# Patient Record
Sex: Female | Born: 1935 | Race: White | Hispanic: No | Marital: Married | Smoking: Never smoker
Health system: Southern US, Community
[De-identification: ages and names within clinical notes are randomized; demographics above are authoritative.]

## PROBLEM LIST (undated history)

## (undated) DIAGNOSIS — I10 Essential (primary) hypertension: Secondary | ICD-10-CM

## (undated) DIAGNOSIS — I82409 Acute embolism and thrombosis of unspecified deep veins of unspecified lower extremity: Secondary | ICD-10-CM

## (undated) DIAGNOSIS — I639 Cerebral infarction, unspecified: Secondary | ICD-10-CM

## (undated) DIAGNOSIS — N1831 Chronic kidney disease, stage 3a: Secondary | ICD-10-CM

## (undated) DIAGNOSIS — I313 Pericardial effusion (noninflammatory): Secondary | ICD-10-CM

## (undated) DIAGNOSIS — I3139 Other pericardial effusion (noninflammatory): Secondary | ICD-10-CM

## (undated) DIAGNOSIS — I351 Nonrheumatic aortic (valve) insufficiency: Secondary | ICD-10-CM

## (undated) DIAGNOSIS — F0390 Unspecified dementia without behavioral disturbance: Secondary | ICD-10-CM

## (undated) HISTORY — PX: ABDOMINAL HYSTERECTOMY: SHX81

---

## 2017-11-23 ENCOUNTER — Emergency Department (HOSPITAL_COMMUNITY)
Admission: EM | Admit: 2017-11-23 | Discharge: 2017-11-23 | Disposition: A | Discharge status: Home / Self Care | Payer: Medicare PPO | Attending: Emergency Medicine | Admitting: Emergency Medicine

## 2017-11-23 ENCOUNTER — Other Ambulatory Visit: Payer: Self-pay

## 2017-11-23 ENCOUNTER — Encounter (HOSPITAL_COMMUNITY): Payer: Self-pay

## 2017-11-23 DIAGNOSIS — R531 Weakness: Secondary | ICD-10-CM

## 2017-11-23 DIAGNOSIS — M6281 Muscle weakness (generalized): Secondary | ICD-10-CM | POA: Insufficient documentation

## 2017-11-23 DIAGNOSIS — I10 Essential (primary) hypertension: Secondary | ICD-10-CM | POA: Insufficient documentation

## 2017-11-23 DIAGNOSIS — Z8673 Personal history of transient ischemic attack (TIA), and cerebral infarction without residual deficits: Secondary | ICD-10-CM | POA: Diagnosis not present

## 2017-11-23 DIAGNOSIS — N3000 Acute cystitis without hematuria: Secondary | ICD-10-CM

## 2017-11-23 HISTORY — DX: Cerebral infarction, unspecified: I63.9

## 2017-11-23 HISTORY — DX: Essential (primary) hypertension: I10

## 2017-11-23 LAB — CBC WITH DIFFERENTIAL/PLATELET
BASOS PCT: 1 %
Basophils Absolute: 0.1 10*3/uL (ref 0.0–0.1)
EOS ABS: 0.1 10*3/uL (ref 0.0–0.7)
Eosinophils Relative: 1 %
HEMATOCRIT: 39.7 % (ref 36.0–46.0)
HEMOGLOBIN: 12.7 g/dL (ref 12.0–15.0)
Lymphocytes Relative: 26 %
Lymphs Abs: 1.9 10*3/uL (ref 0.7–4.0)
MCH: 30.8 pg (ref 26.0–34.0)
MCHC: 32 g/dL (ref 30.0–36.0)
MCV: 96.1 fL (ref 78.0–100.0)
MONOS PCT: 10 %
Monocytes Absolute: 0.8 10*3/uL (ref 0.1–1.0)
NEUTROS ABS: 4.6 10*3/uL (ref 1.7–7.7)
NEUTROS PCT: 62 %
Platelets: 237 10*3/uL (ref 150–400)
RBC: 4.13 MIL/uL (ref 3.87–5.11)
RDW: 13.8 % (ref 11.5–15.5)
WBC: 7.4 10*3/uL (ref 4.0–10.5)

## 2017-11-23 LAB — URINALYSIS, ROUTINE W REFLEX MICROSCOPIC
BILIRUBIN URINE: NEGATIVE
GLUCOSE, UA: NEGATIVE mg/dL
KETONES UR: NEGATIVE mg/dL
NITRITE: NEGATIVE
PH: 7 (ref 5.0–8.0)
Protein, ur: 30 mg/dL — AB
SPECIFIC GRAVITY, URINE: 1.006 (ref 1.005–1.030)

## 2017-11-23 LAB — BASIC METABOLIC PANEL
ANION GAP: 11 (ref 5–15)
BUN: 35 mg/dL — ABNORMAL HIGH (ref 6–20)
CALCIUM: 9.2 mg/dL (ref 8.9–10.3)
CHLORIDE: 108 mmol/L (ref 101–111)
CO2: 22 mmol/L (ref 22–32)
CREATININE: 1.4 mg/dL — AB (ref 0.44–1.00)
GFR calc Af Amer: 40 mL/min — ABNORMAL LOW (ref >60)
GFR calc non Af Amer: 34 mL/min — ABNORMAL LOW (ref >60)
Glucose, Bld: 106 mg/dL — ABNORMAL HIGH (ref 65–99)
Potassium: 4.6 mmol/L (ref 3.5–5.1)
SODIUM: 141 mmol/L (ref 135–145)

## 2017-11-23 LAB — I-STAT TROPONIN, ED: TROPONIN I, POC: 0.02 ng/mL (ref 0.00–0.08)

## 2017-11-23 MED ORDER — CEPHALEXIN 500 MG PO CAPS: ORAL_CAPSULE | ORAL | 0 refills | Status: DC

## 2017-11-23 MED ORDER — CEPHALEXIN 250 MG PO CAPS
1000.0000 mg | ORAL_CAPSULE | Freq: Once | ORAL | Status: AC
  Administered 2017-11-23: 1000 mg via ORAL
  Filled 2017-11-23: qty 4

## 2017-11-23 NOTE — ED Notes (Signed)
Per family the confusion is her baseline.  Family also states that the patient was brought here because her vision has been off and her heart rate was low and blood pressure was high.

## 2017-11-23 NOTE — ED Provider Notes (Signed)
[facility] EMERGENCY DEPARTMENT Provider Note   CSN: [id number] Arrival date & time: 11/23/17  1551     History   Chief Complaint Chief Complaint  Patient presents with  . bradycardia  . Hypotension  . Fall    HPI Tracy Valdez is a 84 y.o. female.  HPI Patient is at assisted living with her husband.  They had been to eat and were going back to the room.  She reports she was feeling a little weak and made mention of that.  She reports she really does not feel badly.  Her husband took her blood pressure and noted her blood pressure be slightly low with systolic 258N over diastolic 277 and heart rate low in the 40s.  No syncopal episode, no chest pain, no shortness of breath, no headache, no focal weakness numbness or tingling.  Patient denies recent fever, chills, cough, pain burning urgency with urination.  Review of medications indicate that she is taking carvedilol 25 mg daily amlodipine 10 mg daily and an ARB. Past Medical History:  Diagnosis Date  . Hypertension   . Stroke [facility])     There are no active problems to display for this patient.   History reviewed. No pertinent surgical history.  OB History    No data available       Home Medications    Prior to Admission medications   Medication Sig Start Date End Date Taking? Authorizing Provider  cephALEXin (KEFLEX) 500 MG capsule 2 caps po bid x 7 days 11/23/17   Charlesetta Shanks, MD    Family History No family history on file.  Social History Social History   Tobacco Use  . Smoking status: Not on file  Substance Use Topics  . Alcohol use: Not on file  . Drug use: Not on file     Allergies   Patient has no known allergies.   Review of Systems Review of Systems 10 Systems reviewed and are negative for acute change except as noted in the HPI.   Physical Exam Updated Vital Signs BP (!) 166/90   Pulse 70   Temp 98 F (36.7 C) (Oral)   Resp 20   Wt 61.2 kg (135 lb)   SpO2 97%    Physical Exam  Constitutional: She is oriented to person, place, and time. She appears well-developed and well-nourished. No distress.  HENT:  Head: Normocephalic and atraumatic.  Mouth/Throat: Oropharynx is clear and moist.  Eyes: Conjunctivae and EOM are normal. Pupils are equal, round, and reactive to light.  Neck: Neck supple.  Cardiovascular: Normal rate and regular rhythm.  No murmur heard. Pulmonary/Chest: Effort normal and breath sounds normal. No respiratory distress.  Abdominal: Soft. There is no tenderness.  Musculoskeletal: She exhibits no edema.  Neurological: She is alert and oriented to person, place, and time. No cranial nerve deficit. She exhibits normal muscle tone. Coordination normal.  Skin: Skin is warm and dry.  Psychiatric: She has a normal mood and affect.  Nursing note and vitals reviewed.    ED Treatments / Results  Labs (all labs ordered are listed, but only abnormal results are displayed) Labs Reviewed  BASIC METABOLIC PANEL - Abnormal; Notable for the following components:      Result Value   Glucose, Bld 106 (*)    BUN 35 (*)    Creatinine, Ser 1.40 (*)    GFR calc non Af Amer 34 (*)    GFR calc Af Amer 40 (*)  All other components within normal limits  URINALYSIS, ROUTINE W REFLEX MICROSCOPIC - Abnormal; Notable for the following components:   APPearance CLOUDY (*)    Hgb urine dipstick SMALL (*)    Protein, ur 30 (*)    Leukocytes, UA LARGE (*)    Bacteria, UA MANY (*)    Squamous Epithelial / LPF 0-5 (*)    All other components within normal limits  URINE CULTURE  CBC WITH DIFFERENTIAL/PLATELET  I-STAT TROPONIN, ED    EKG  EKG Interpretation  Date/Time:  Thursday November 23 2017 15:51:52 EST Ventricular Rate:  60 PR Interval:    QRS Duration: 114 QT Interval:  473 QTC Calculation: 473 R Axis:   73 Text Interpretation:  Sinus or ectopic atrial rhythm Atrial premature complexes Prolonged PR interval Possible  inferior infarct,  old no STEMI. no old comparison Confirmed by Charlesetta Shanks [phone]) on 11/23/2017 4:04:31 PM       Radiology No results found.  Procedures Procedures (including critical care time)  Medications Ordered in ED Medications  cephALEXin (KEFLEX) capsule 1,000 mg (not administered)     Initial Impression / Assessment and Plan / ED Course  I have reviewed the triage vital signs and the nursing notes.  Pertinent labs & imaging results that were available during my care of the patient were reviewed by me and considered in my medical decision making (see chart for details).        Final Clinical Impressions(s) / ED Diagnoses   Final diagnoses:  Generalized weakness  Acute cystitis without hematuria   Patient is alert and appropriate and asymptomatic.  She reports minimal symptoms that precipitated her visit to the emergency department.  Husband reports that he was concerned about her blood pressure and heart rate being to the low side.  Patient does have a grossly positive urinalysis.  I suspect this is the etiology for some mild generalized weakness that she was experiencing.  Will initiate Keflex twice daily.  We also discussed possibility of medication adjustments however patient's blood pressure and heart rate have not been hypotensive nor bradycardic in the emergency department.  There will be scheduled close follow-up with the PCP to monitor and make medication changes if needed.  Return precautions reviewed. ED Discharge Orders        Ordered    cephALEXin (KEFLEX) 500 MG capsule     11/23/17 1804       Charlesetta Shanks, MD 11/23/17 1191

## 2017-11-23 NOTE — ED Triage Notes (Signed)
Pt presents to the ED with ems. Per ems family states that her blood pressure and heart rate was low so they wanted her to get checked out.  Per the patient she fell two weeks ago and that is why she is here.  EMS unsure if the confusion is new or chronic, family is on the way.  The family just moved here from [place] and have not set up primary care.

## 2017-11-23 NOTE — Discharge Instructions (Signed)
1.  Start Keflex twice daily as prescribed.  You were given your first dose in the emergency department.  You may begin taking her prescription tomorrow morning. 2.  See your doctor for recheck as soon as possible. 3.  Return to the emergency department if you have any concerning or worsening symptoms. 4.  Decrease your carvedilol dose to half.  That is 12.5 mg twice daily.  Monitor your heart rate and your blood pressure and keep a log to show your physician.

## 2017-11-26 LAB — URINE CULTURE: Culture: >=100000 — AB

## 2017-11-27 ENCOUNTER — Telehealth: Payer: Self-pay | Admitting: Emergency Medicine

## 2017-11-27 NOTE — Telephone Encounter (Signed)
Post ED Visit - Positive Culture Follow-up  Culture report reviewed by antimicrobial stewardship pharmacist:  [x]  Elenor Quinones, Pharm.D. []  Heide Guile, Pharm.D., BCPS AQ-ID []  Parks Neptune, Pharm.D., BCPS []  Alycia Rossetti, Pharm.D., BCPS []  [place], Pharm.D., BCPS, AAHIVP []  Legrand Como, Pharm.D., BCPS, AAHIVP []  Salome Arnt, PharmD, BCPS []  Jalene Mullet, PharmD []  Vincenza Hews, PharmD, BCPS  Positive urine culture Treated with cephalexin, organism sensitive to the same and no further patient follow-up is required at this time.  Hazle Nordmann 11/27/2017, 9:59 AM

## 2018-04-06 ENCOUNTER — Encounter (HOSPITAL_BASED_OUTPATIENT_CLINIC_OR_DEPARTMENT_OTHER): Payer: Self-pay | Admitting: *Deleted

## 2018-04-06 ENCOUNTER — Emergency Department (HOSPITAL_BASED_OUTPATIENT_CLINIC_OR_DEPARTMENT_OTHER)
Admission: EM | Admit: 2018-04-06 | Discharge: 2018-04-06 | Disposition: A | Discharge status: Home / Self Care | Payer: Medicare PPO | Attending: Emergency Medicine | Admitting: Emergency Medicine

## 2018-04-06 ENCOUNTER — Other Ambulatory Visit: Payer: Self-pay

## 2018-04-06 ENCOUNTER — Emergency Department (HOSPITAL_BASED_OUTPATIENT_CLINIC_OR_DEPARTMENT_OTHER): Payer: Medicare PPO

## 2018-04-06 DIAGNOSIS — W19XXXA Unspecified fall, initial encounter: Secondary | ICD-10-CM

## 2018-04-06 DIAGNOSIS — S0990XA Unspecified injury of head, initial encounter: Secondary | ICD-10-CM | POA: Diagnosis present

## 2018-04-06 DIAGNOSIS — W01198A Fall on same level from slipping, tripping and stumbling with subsequent striking against other object, initial encounter: Secondary | ICD-10-CM | POA: Diagnosis not present

## 2018-04-06 DIAGNOSIS — I10 Essential (primary) hypertension: Secondary | ICD-10-CM | POA: Insufficient documentation

## 2018-04-06 DIAGNOSIS — Y9389 Activity, other specified: Secondary | ICD-10-CM | POA: Diagnosis not present

## 2018-04-06 DIAGNOSIS — Y92191 Dining room in other specified residential institution as the place of occurrence of the external cause: Secondary | ICD-10-CM | POA: Insufficient documentation

## 2018-04-06 DIAGNOSIS — S0003XA Contusion of scalp, initial encounter: Secondary | ICD-10-CM | POA: Diagnosis not present

## 2018-04-06 DIAGNOSIS — Y999 Unspecified external cause status: Secondary | ICD-10-CM | POA: Diagnosis not present

## 2018-04-06 NOTE — ED Provider Notes (Signed)
[facility] EMERGENCY DEPARTMENT Provider Note   CSN: [id number] Arrival date & time: 04/06/18  1519     History   Chief Complaint Chief Complaint  Patient presents with  . Fall    HPI Tracy Valdez is a 84 y.o. female.  84 year old female prior history of stroke and hypertension here with Fall at home.  She is brought in by EMS.  She denies any complaints.  She states she was using her walker after lunch and tripped over the rug and fell back and struck the right side of her head.  There is no LOC.  She denies head pain neck pain back pain chest pain abdominal pain numbness or weakness.   The history is provided by the patient and the EMS personnel.   Fall   This is a new problem. The current episode started less than 1 hour ago. The problem has not changed since onset.Pertinent negatives include no chest pain, no abdominal pain, no headaches and no shortness of breath.  Nothing aggravates the symptoms.  Nothing relieves the symptoms. She has tried nothing for the symptoms. The treatment provided no relief.    Past Medical History:  Diagnosis Date  . Hypertension   . Stroke [facility])     There are no active problems to display for this patient.   Past Surgical History:  Procedure Laterality Date  . ABDOMINAL HYSTERECTOMY       OB History   None      Home Medications    Prior to Admission medications   Medication Sig Start Date End Date Taking? Authorizing Provider  cephALEXin (KEFLEX) 500 MG capsule 2 caps po bid x 7 days 11/23/17   Charlesetta Shanks, MD    Family History History reviewed. No pertinent family history.  Social History Social History   Tobacco Use  . Smoking status: Never Smoker  . Smokeless tobacco: Never Used  Substance Use Topics  . Alcohol use: Never    Frequency: Never  . Drug use: Never     Allergies   Patient has no known allergies.   Review of Systems Review of Systems  Constitutional: Negative for fever.  HENT:  Negative for sore throat.   Eyes: Negative for visual disturbance.  Respiratory: Negative for shortness of breath.   Cardiovascular: Negative for chest pain.  Gastrointestinal: Negative for abdominal pain.  Genitourinary: Negative for dysuria.  Musculoskeletal: Negative for back pain and neck pain.  Skin: Negative for rash.  Neurological: Negative for seizures and headaches.     Physical Exam Updated Vital Signs BP (!) 168/40 (BP Location: Left Arm) Comment: RN Caryl Pina informed  Pulse 61   Temp 98.7 F (37.1 C) (Oral)   Resp 18   Ht 5' (1.524 m)   Wt 72.6 kg (160 lb)   SpO2 98%   BMI 31.25 kg/m    Physical Exam  Constitutional: She appears well-developed and well-nourished.  HENT:  Head: Normocephalic.  Right Ear: External ear normal.  Left Ear: External ear normal.  Nose: Nose normal.  Mouth/Throat: Oropharynx is clear and moist.  3x3cm hematoma right temproal area. No bleeding,   Eyes: Pupils are equal, round, and reactive to light. Conjunctivae and EOM are normal.  Neck: Neck supple.  Cardiovascular: Normal rate, regular rhythm, normal heart sounds and intact distal pulses.  Pulmonary/Chest: Effort normal. No respiratory distress. She has no wheezes. She has no rales.  Abdominal: Soft. There is no tenderness. There is no guarding.  Musculoskeletal: Normal range  of motion. She exhibits no tenderness or deformity.  Neurological: She is alert. She has normal strength. No sensory deficit. GCS eye subscore is 4. GCS verbal subscore is 5. GCS motor subscore is 6.  Skin: Skin is warm and dry. Capillary refill takes less than 2 seconds.  Psychiatric: She has a normal mood and affect.  Nursing note and vitals reviewed.    ED Treatments / Results  Labs (all labs ordered are listed, but only abnormal results are displayed) Labs Reviewed - No data to display  EKG None  Radiology Ct Head Wo Contrast  Result Date: 04/06/2018 CLINICAL DATA:  Patient fell at home today  striking her head. No loss of consciousness. History of hypertension and stroke. EXAM: CT HEAD WITHOUT CONTRAST CT CERVICAL SPINE WITHOUT CONTRAST TECHNIQUE: Multidetector CT imaging of the head and cervical spine was performed following the standard protocol without intravenous contrast. Multiplanar CT image reconstructions of the cervical spine were also generated. COMPARISON:  None. FINDINGS: CT HEAD FINDINGS Brain: There is no evidence of acute intracranial hemorrhage, mass lesion, brain edema or extra-axial fluid collection. There is generalized prominence of the ventricles and subarachnoid spaces consistent with atrophy. There is an old infarct in the right occipital lobe. Patchy low-density is present in the periventricular and subcortical white matter bilaterally. There is no CT evidence of acute cortical infarction. Vascular: Intracranial vascular calcifications. No hyperdense vessel identified. Skull: Negative for fracture or focal lesion. Sinuses/Orbits: The visualized paranasal sinuses and mastoid air cells are clear. No orbital abnormalities are seen. Other: Possible soft tissue swelling in the right parietal scalp. CT CERVICAL SPINE FINDINGS Alignment: Normal. Skull base and vertebrae: No evidence of acute fracture or traumatic subluxation. Soft tissues and spinal canal: No prevertebral fluid or swelling. No visible canal hematoma. Disc levels: There are mild-to-moderate degenerative changes throughout the cervical spine with disc space narrowing, endplate sclerosis and osteophytes greatest at C3-4. There are mild facet degenerative changes throughout the cervical spine. No acute disc herniation or high-grade spinal stenosis identified. Upper chest: Atherosclerosis of the great vessels with mild scarring in both lung apices. Other: None. IMPRESSION: 1. No acute intracranial findings. 2. Old right occipital infarct with chronic small vessel ischemic changes in the periventricular white matter and  generalized atrophy. 3. No evidence of acute cervical spine fracture, traumatic subluxation or static signs of instability. 4. Cervical spondylosis as described. Electronically Signed   By: Richardean Sale M.D.   On: 04/06/2018 16:04   Ct Cervical Spine Wo Contrast  Result Date: 04/06/2018 CLINICAL DATA:  Patient fell at home today striking her head. No loss of consciousness. History of hypertension and stroke. EXAM: CT HEAD WITHOUT CONTRAST CT CERVICAL SPINE WITHOUT CONTRAST TECHNIQUE: Multidetector CT imaging of the head and cervical spine was performed following the standard protocol without intravenous contrast. Multiplanar CT image reconstructions of the cervical spine were also generated. COMPARISON:  None. FINDINGS: CT HEAD FINDINGS Brain: There is no evidence of acute intracranial hemorrhage, mass lesion, brain edema or extra-axial fluid collection. There is generalized prominence of the ventricles and subarachnoid spaces consistent with atrophy. There is an old infarct in the right occipital lobe. Patchy low-density is present in the periventricular and subcortical white matter bilaterally. There is no CT evidence of acute cortical infarction. Vascular: Intracranial vascular calcifications. No hyperdense vessel identified. Skull: Negative for fracture or focal lesion. Sinuses/Orbits: The visualized paranasal sinuses and mastoid air cells are clear. No orbital abnormalities are seen. Other: Possible soft tissue swelling in the  right parietal scalp. CT CERVICAL SPINE FINDINGS Alignment: Normal. Skull base and vertebrae: No evidence of acute fracture or traumatic subluxation. Soft tissues and spinal canal: No prevertebral fluid or swelling. No visible canal hematoma. Disc levels: There are mild-to-moderate degenerative changes throughout the cervical spine with disc space narrowing, endplate sclerosis and osteophytes greatest at C3-4. There are mild facet degenerative changes throughout the cervical spine.  No acute disc herniation or high-grade spinal stenosis identified. Upper chest: Atherosclerosis of the great vessels with mild scarring in both lung apices. Other: None. IMPRESSION: 1. No acute intracranial findings. 2. Old right occipital infarct with chronic small vessel ischemic changes in the periventricular white matter and generalized atrophy. 3. No evidence of acute cervical spine fracture, traumatic subluxation or static signs of instability. 4. Cervical spondylosis as described. Electronically Signed   By: Richardean Sale M.D.   On: 04/06/2018 16:04    Procedures Procedures (including critical care time)  Medications Ordered in ED Medications - No data to display   Initial Impression / Assessment and Plan / ED Course  I have reviewed the triage vital signs and the nursing notes.  Pertinent labs & imaging results that were available during my care of the patient were reviewed by me and considered in my medical decision making (see chart for details).  Clinical Course as of Apr 07 1221  Fri Apr 06, 6569  [id number] 84 year old female not on anticoagulation here with a fall at home likely mechanical.  She does have a hematoma on her right temporal area.  Due to her age and the area of the injury I would get a head CT and C-spine CT.  Likely she can be discharged if they are negative.   [MB]    Clinical Course User Index [MB] Hayden Rasmussen, MD    Final Clinical Impressions(s) / ED Diagnoses   Final diagnoses:  Contusion of scalp, initial encounter  Fall, initial encounter    ED Discharge Orders    None      Hayden Rasmussen, MD 04/07/18 1222

## 2018-04-06 NOTE — ED Notes (Signed)
ED Provider at bedside. 

## 2018-04-06 NOTE — ED Triage Notes (Signed)
Family states unwitnessed fall  Today, denies complaints,  from independent living , brought by EMS

## 2018-04-06 NOTE — ED Notes (Signed)
Pt transported to CT at this time.

## 2018-04-06 NOTE — Discharge Instructions (Addendum)
Your evaluated in the emergency department for a fall with an injury to your head.  You had a CAT scan of your head and neck that did not show any obvious signs of injury other than the bruise.  You should use ice to the area Tylenol for pain and follow-up with your doctor.  Return to the emergency department if any worsening symptoms.

## 2019-08-18 ENCOUNTER — Emergency Department (HOSPITAL_COMMUNITY): Payer: Medicare Other

## 2019-08-18 ENCOUNTER — Other Ambulatory Visit: Payer: Self-pay

## 2019-08-18 ENCOUNTER — Encounter (HOSPITAL_COMMUNITY): Payer: Self-pay

## 2019-08-18 ENCOUNTER — Emergency Department (HOSPITAL_COMMUNITY)
Admission: EM | Admit: 2019-08-18 | Discharge: 2019-08-18 | Disposition: A | Discharge status: Home / Self Care | Payer: Medicare Other | Attending: Emergency Medicine | Admitting: Emergency Medicine

## 2019-08-18 DIAGNOSIS — Y939 Activity, unspecified: Secondary | ICD-10-CM | POA: Diagnosis not present

## 2019-08-18 DIAGNOSIS — I10 Essential (primary) hypertension: Secondary | ICD-10-CM | POA: Diagnosis not present

## 2019-08-18 DIAGNOSIS — Z8673 Personal history of transient ischemic attack (TIA), and cerebral infarction without residual deficits: Secondary | ICD-10-CM | POA: Insufficient documentation

## 2019-08-18 DIAGNOSIS — S0001XA Abrasion of scalp, initial encounter: Secondary | ICD-10-CM | POA: Diagnosis present

## 2019-08-18 DIAGNOSIS — Y929 Unspecified place or not applicable: Secondary | ICD-10-CM | POA: Diagnosis not present

## 2019-08-18 DIAGNOSIS — M542 Cervicalgia: Secondary | ICD-10-CM | POA: Diagnosis not present

## 2019-08-18 DIAGNOSIS — R519 Headache, unspecified: Secondary | ICD-10-CM | POA: Diagnosis not present

## 2019-08-18 DIAGNOSIS — W1830XA Fall on same level, unspecified, initial encounter: Secondary | ICD-10-CM | POA: Diagnosis not present

## 2019-08-18 DIAGNOSIS — Z79899 Other long term (current) drug therapy: Secondary | ICD-10-CM | POA: Diagnosis not present

## 2019-08-18 DIAGNOSIS — Y999 Unspecified external cause status: Secondary | ICD-10-CM | POA: Diagnosis not present

## 2019-08-18 DIAGNOSIS — W19XXXA Unspecified fall, initial encounter: Secondary | ICD-10-CM

## 2019-08-18 DIAGNOSIS — S0083XA Contusion of other part of head, initial encounter: Secondary | ICD-10-CM | POA: Insufficient documentation

## 2019-08-18 LAB — CBC WITH DIFFERENTIAL/PLATELET
Abs Immature Granulocytes: 0.03 10*3/uL (ref 0.00–0.07)
Basophils Absolute: 0.1 10*3/uL (ref 0.0–0.1)
Basophils Relative: 1 %
Eosinophils Absolute: 0.1 10*3/uL (ref 0.0–0.5)
Eosinophils Relative: 1 %
HCT: 37.6 % (ref 36.0–46.0)
Hemoglobin: 11.8 g/dL — ABNORMAL LOW (ref 12.0–15.0)
Immature Granulocytes: 0 %
Lymphocytes Relative: 17 %
Lymphs Abs: 1.2 10*3/uL (ref 0.7–4.0)
MCH: 30.7 pg (ref 26.0–34.0)
MCHC: 31.4 g/dL (ref 30.0–36.0)
MCV: 97.9 fL (ref 80.0–100.0)
Monocytes Absolute: 0.6 10*3/uL (ref 0.1–1.0)
Monocytes Relative: 8 %
Neutro Abs: 5.3 10*3/uL (ref 1.7–7.7)
Neutrophils Relative %: 73 %
Platelets: 220 10*3/uL (ref 150–400)
RBC: 3.84 MIL/uL — ABNORMAL LOW (ref 3.87–5.11)
RDW: 13.6 % (ref 11.5–15.5)
WBC: 7.2 10*3/uL (ref 4.0–10.5)
nRBC: 0 % (ref 0.0–0.2)

## 2019-08-18 LAB — BASIC METABOLIC PANEL
Anion gap: 8 (ref 5–15)
BUN: 29 mg/dL — ABNORMAL HIGH (ref 8–23)
CO2: 25 mmol/L (ref 22–32)
Calcium: 8.8 mg/dL — ABNORMAL LOW (ref 8.9–10.3)
Chloride: 110 mmol/L (ref 98–111)
Creatinine, Ser: 1.21 mg/dL — ABNORMAL HIGH (ref 0.44–1.00)
GFR calc Af Amer: 48 mL/min — ABNORMAL LOW (ref >60)
GFR calc non Af Amer: 41 mL/min — ABNORMAL LOW (ref >60)
Glucose, Bld: 154 mg/dL — ABNORMAL HIGH (ref 70–99)
Potassium: 4.4 mmol/L (ref 3.5–5.1)
Sodium: 143 mmol/L (ref 135–145)

## 2019-08-18 NOTE — ED Provider Notes (Signed)
[facility] DEPT Provider Note   CSN: [id number] Arrival date & time: 08/18/19  1215     History   Chief Complaint Chief Complaint  Patient presents with  . Fall  . hematoma    HPI Tracy Valdez is a 85 y.o. female.  Presents the ER after unwitnessed fall.  Patient reports yesterday she had a fall, cannot recall the events of the fall, does not remember losing consciousness, but also does not remember details about fall.  Denies any pain at this time, states she noted some bleeding from her head but denies noting any other trauma.  Specifically she denies any chest pain abdominal pain, back pain, neck pain or difficulty with ambulation.  Level 5 history caveat limited due to baseline dementia    HPI  Past Medical History:  Diagnosis Date  . Hypertension   . Stroke [facility])     There are no active problems to display for this patient.   Past Surgical History:  Procedure Laterality Date  . ABDOMINAL HYSTERECTOMY       OB History   No obstetric history on file.      Home Medications    Prior to Admission medications   Medication Sig Start Date End Date Taking? Authorizing Provider  amLODipine (NORVASC) 10 MG tablet Take 10 mg by mouth daily. 08/01/19  Yes [provider]  aspirin 81 MG chewable tablet Chew 81 mg by mouth.   Yes [provider]  atorvastatin (LIPITOR) 20 MG tablet Take 20 mg by mouth daily. 08/15/19  Yes [provider]  carvedilol (COREG) 12.5 MG tablet Take 12.5 mg by mouth 2 (two) times daily. 06/14/19  Yes [provider]  furosemide (LASIX) 20 MG tablet Take 20 mg by mouth daily. 07/18/19  Yes [provider]  irbesartan (AVAPRO) 150 MG tablet Take 300 mg by mouth daily. 06/02/19  Yes [provider]  memantine (NAMENDA) 10 MG tablet Take 10 mg by mouth 2 (two) times daily. 07/17/19  Yes [provider]  rivastigmine (EXELON) 1.5 MG capsule Take 1.5-3 mg by  mouth See admin instructions. Take 1 tablet in the morning, take 2 tablets in the evening 07/25/19  Yes [provider]    Family History Family History  Family history unknown: Yes    Social History Social History   Tobacco Use  . Smoking status: Never Smoker  . Smokeless tobacco: Never Used  Substance Use Topics  . Alcohol use: Never    Frequency: Never  . Drug use: Never     Allergies   Patient has no known allergies.   Review of Systems Review of Systems  Constitutional: Negative for chills and fever.  HENT: Negative for ear pain and sore throat.   Eyes: Negative for pain and visual disturbance.  Respiratory: Negative for cough and shortness of breath.   Cardiovascular: Negative for chest pain and palpitations.  Gastrointestinal: Negative for abdominal pain and vomiting.  Genitourinary: Negative for dysuria and hematuria.  Musculoskeletal: Negative for arthralgias and back pain.  Skin: Negative for color change and rash.  Neurological: Negative for seizures and syncope.  All other systems reviewed and are negative.    Physical Exam Updated Vital Signs BP (!) 165/33 (BP Location: Right Arm)   Pulse 60   Temp 98.2 F (36.8 C) (Oral)   Resp 16   Ht 5\' 5"  (1.651 m)   Wt 70.3 kg   SpO2 100%   BMI 25.79 kg/m  Physical Exam Vitals signs and nursing note reviewed.  Constitutional:      General: She is not in acute distress.    Appearance: She is well-developed.  HENT:     Head: Normocephalic and atraumatic.     Comments: Hematoma to posterior occiput, no active bleeding, 2 cm diameter superficial abrasion noted, no significant laceration Eyes:     Conjunctiva/sclera: Conjunctivae normal.  Neck:     Musculoskeletal: Neck supple.  Cardiovascular:     Rate and Rhythm: Normal rate and regular rhythm.     Heart sounds: No murmur.  Pulmonary:     Effort: Pulmonary effort is normal. No respiratory distress.     Breath sounds: Normal breath sounds.   Abdominal:     Palpations: Abdomen is soft.     Tenderness: There is no abdominal tenderness.  Skin:    General: Skin is warm and dry.  Neurological:     Mental Status: She is alert.     Comments: Alert, follows commands in all 4 extremities, speech clear      ED Treatments / Results  Labs (all labs ordered are listed, but only abnormal results are displayed) Labs Reviewed  CBC WITH DIFFERENTIAL/PLATELET - Abnormal; Notable for the following components:      Result Value   RBC 3.84 (*)    Hemoglobin 11.8 (*)    All other components within normal limits  BASIC METABOLIC PANEL - Abnormal; Notable for the following components:   Glucose, Bld 154 (*)    BUN 29 (*)    Creatinine, Ser 1.21 (*)    Calcium 8.8 (*)    GFR calc non Af Amer 41 (*)    GFR calc Af Amer 48 (*)    All other components within normal limits    EKG EKG Interpretation  Date/Time:  Sunday August 18 2019 14:07:33 EDT Ventricular Rate:  55 PR Interval:    QRS Duration: 98 QT Interval:  478 QTC Calculation: 457 R Axis:   35 Text Interpretation:  Junctional rhythm Inferior infarct , age undetermined Abnormal ECG Confirmed by Madalyn Rob [phone]) on 08/18/2019 2:15:52 PM   Radiology Dg Chest 2 View  Result Date: 08/18/2019 CLINICAL DATA:  85 year old female with history of trauma from a fall from standing. Chest pain. EXAM: CHEST - 2 VIEW COMPARISON:  No priors. FINDINGS: Lung volumes are normal. No consolidative airspace disease. No pleural effusions. No evidence of pulmonary edema. Heart size is mildly enlarged. Upper mediastinal contours are within normal limits. Aortic atherosclerosis. IMPRESSION: 1. No radiographic evidence of acute cardiopulmonary disease. 2. Mild cardiomegaly. 3. Aortic atherosclerosis. Electronically Signed   By: Vinnie Langton M.D.   On: 08/18/2019 13:50   Dg Pelvis 1-2 Views  Result Date: 08/18/2019 CLINICAL DATA:  Pain after fall EXAM: PELVIS - 1-2 VIEW COMPARISON:   None. FINDINGS: There is no evidence of pelvic fracture or diastasis. No pelvic bone lesions are seen. IMPRESSION: Negative. Electronically Signed   By: Dorise Bullion III M.D   On: 08/18/2019 13:53   Ct Head Wo Contrast  Result Date: 08/18/2019 CLINICAL DATA:  Headache, unwitnessed fall. Scalp hematoma EXAM: CT HEAD WITHOUT CONTRAST TECHNIQUE: Contiguous axial images were obtained from the base of the skull through the vertex without intravenous contrast. COMPARISON:  04/06/2018 FINDINGS: Brain: No evidence of acute infarction, hemorrhage, hydrocephalus, extra-axial collection or mass lesion/mass effect. Right occipital encephalomalacia related to a remote right PCA territory infarct, unchanged. Scattered low-density changes within the periventricular and subcortical white matter  compatible with chronic microvascular ischemic change. Moderate diffuse cerebral volume loss. Vascular: Mild atherosclerotic calcifications involving the large vessels of the skull base. No unexpected hyperdense vessel. Skull: Negative first skull fracture. Sinuses/Orbits: No acute finding. Other: Small superficial scalp hematoma overlies the right occipital bone. IMPRESSION: 1. No acute intracranial findings. 2. Small superficial scalp hematoma overlying the right occipital bone. No underlying skull fracture. 3. Chronic stable small vessel ischemic disease and cerebral volume loss. 4. Remote right PCA territory infarct. Electronically Signed   By: Davina Poke M.D.   On: 08/18/2019 13:48   Ct Cervical Spine Wo Contrast  Result Date: 08/18/2019 CLINICAL DATA:  Neck pain after fall EXAM: CT CERVICAL SPINE WITHOUT CONTRAST TECHNIQUE: Multidetector CT imaging of the cervical spine was performed without intravenous contrast. Multiplanar CT image reconstructions were also generated. COMPARISON:  None. FINDINGS: Alignment: Trace anterolisthesis C2 on C3. Facet joints are aligned without dislocation. 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 No acute fracture. No bone lesion. Soft tissues and spinal canal: No prevertebral fluid or swelling. No visible canal hematoma. Disc levels: Advanced intervertebral disc height loss at C3-4. Mildly prominent posterior disc osteophyte complex at C5-6. The ossified transverse ligament may cause mild stenosis at the C1-2 level. No evidence of high-grade canal stenosis by CT. Multilevel facet and uncovertebral arthropathy Upper chest: Lung apices clear. 13 mm right thyroid lobe nodule which does not meet criteria for dedicated thyroid imaging. Other: None. IMPRESSION: 1. No acute cervical spine fracture or posttraumatic subluxation. 2. 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 Electronically Signed   By: Davina Poke M.D.   On: 08/18/2019 13:56    Procedures Procedures (including critical care time)  Medications Ordered in ED Medications - No data to display   Initial Impression / Assessment and Plan / ED Course  I have reviewed the triage vital signs and the nursing notes.  Pertinent labs & imaging results that were available during my care of the patient were reviewed by me and considered in my medical decision making (see chart for details).  Clinical Course as of Aug 18 1923  Nancy Fetter Aug 18, 2019  1244 Complete initial assessment, will check screening labs, CT head   [RD]    Clinical Course User Index [RD] Lucrezia Starch, MD       85 year old unwitnessed fall, noted to have head trauma.  Here patient is well-appearing, no acute complaints, at mental baseline per report.  Noted superficial abrasion, no significant laceration over her posterior occiput.  CT head was negative for acute traumatic process.  Labs without acute change.  Will discharge back to facility.   Remained well appearing with stable vitals and no recurrence of bleeding from scalp abrasion.    After the discussed management above, the patient was determined to be safe for discharge.  The patient was in agreement with this plan and all questions regarding their care were answered.  ED return precautions were discussed and the patient will return to the ED with any significant worsening of condition.    Final Clinical Impressions(s) / ED Diagnoses   Final diagnoses:  Fall, initial encounter  Abrasion of scalp, initial encounter    ED Discharge Orders    None       Lucrezia Starch, MD 08/18/19 1925

## 2019-08-18 NOTE — ED Notes (Signed)
Attempted to call [facility],but no one answered for report.

## 2019-08-18 NOTE — ED Notes (Signed)
Pt daughter Omar Person [phone] in ED lobby; advised pt is unable to receive visitors d/t hallway bed. Pt daughter requests contact from MD/RN w/pt status/updates when possible. RN advised. [employer]

## 2019-08-18 NOTE — ED Notes (Signed)
Patient transported to CT 

## 2019-08-18 NOTE — Discharge Instructions (Addendum)
Recommend recheck with primary doctor next week.  Please return if she has any increased level of confusion, chest pain, difficulty breathing, abdominal pain or other new concerns symptom.  Please keep wound clean and dry.  Apply direct pressure as needed.  If direct pressure does not stop any further bleeding, recommend return to ER for reassessment.

## 2019-08-18 NOTE — ED Triage Notes (Signed)
Per EMS- patient is a resident of [facility]. Patient had an unwitnessed fall. Patient has a history of dementia and is oriented to self which is her baseline. Patient has a hematoma to the back of her head. No other injuries noted.

## 2019-09-16 ENCOUNTER — Emergency Department (HOSPITAL_COMMUNITY): Payer: Medicare Other

## 2019-09-16 ENCOUNTER — Emergency Department (HOSPITAL_COMMUNITY)
Admission: EM | Admit: 2019-09-16 | Discharge: 2019-09-16 | Disposition: A | Discharge status: Home / Self Care | Payer: Medicare Other | Attending: Emergency Medicine | Admitting: Emergency Medicine

## 2019-09-16 ENCOUNTER — Other Ambulatory Visit: Payer: Self-pay

## 2019-09-16 ENCOUNTER — Encounter (HOSPITAL_COMMUNITY): Payer: Self-pay

## 2019-09-16 DIAGNOSIS — W19XXXA Unspecified fall, initial encounter: Secondary | ICD-10-CM

## 2019-09-16 DIAGNOSIS — F0390 Unspecified dementia without behavioral disturbance: Secondary | ICD-10-CM | POA: Diagnosis not present

## 2019-09-16 DIAGNOSIS — S0240DA Maxillary fracture, left side, initial encounter for closed fracture: Secondary | ICD-10-CM

## 2019-09-16 DIAGNOSIS — S01112A Laceration without foreign body of left eyelid and periocular area, initial encounter: Secondary | ICD-10-CM | POA: Insufficient documentation

## 2019-09-16 DIAGNOSIS — Y92481 Parking lot as the place of occurrence of the external cause: Secondary | ICD-10-CM | POA: Insufficient documentation

## 2019-09-16 DIAGNOSIS — Z8673 Personal history of transient ischemic attack (TIA), and cerebral infarction without residual deficits: Secondary | ICD-10-CM | POA: Insufficient documentation

## 2019-09-16 DIAGNOSIS — Z79899 Other long term (current) drug therapy: Secondary | ICD-10-CM | POA: Insufficient documentation

## 2019-09-16 DIAGNOSIS — S0181XA Laceration without foreign body of other part of head, initial encounter: Secondary | ICD-10-CM

## 2019-09-16 DIAGNOSIS — Y9301 Activity, walking, marching and hiking: Secondary | ICD-10-CM | POA: Diagnosis not present

## 2019-09-16 DIAGNOSIS — W010XXA Fall on same level from slipping, tripping and stumbling without subsequent striking against object, initial encounter: Secondary | ICD-10-CM | POA: Insufficient documentation

## 2019-09-16 DIAGNOSIS — I10 Essential (primary) hypertension: Secondary | ICD-10-CM | POA: Diagnosis not present

## 2019-09-16 DIAGNOSIS — Y999 Unspecified external cause status: Secondary | ICD-10-CM | POA: Insufficient documentation

## 2019-09-16 MED ORDER — LIDOCAINE-EPINEPHRINE 2 %-1:200000 IJ SOLN
20.0000 mL | Freq: Once | INTRAMUSCULAR | Status: AC
Start: 2019-09-16 — End: 2019-09-16
  Administered 2019-09-16: 20 mL
  Filled 2019-09-16: qty 20

## 2019-09-16 NOTE — ED Triage Notes (Signed)
Per ems, pt had a witnessed fall. And has a facial laceration. Pt has a hxt of dementia, denies pain. Pt has a hxt of htn. No blood thinners per ems. 182/60, 97ra, 60hr, 161cbg.

## 2019-09-16 NOTE — ED Notes (Signed)
Pt ambulated without any problems 

## 2019-09-16 NOTE — Discharge Instructions (Addendum)
Do not blow your nose for the next week.

## 2019-09-16 NOTE — ED Provider Notes (Signed)
[facility] EMERGENCY DEPARTMENT Provider Note   CSN: [id number] Arrival date & time: 09/16/19  1708     History   Chief Complaint No chief complaint on file.   HPI Tracy Valdez is a 85 y.o. female.     The history is provided by the patient, the EMS personnel, the spouse and medical records. No language interpreter was used.   Tracy Valdez is a 85 y.o. female who presents to the Emergency Department complaining of fall. Level V caveat due to dementia. History is provided by EMS and the patient's husband. She presents the emergency department by EMS for evaluation of injuries following a mechanical fall. She was walking in a parking lot with a walker when the wheel caught in a crack, causing her to trip and fall forward, striking her face. No loss of consciousness. She complains of pain to her right face, no additional complaints. No recent illnesses. She takes no blood thinners. She does have a history of dementia. Past Medical History:  Diagnosis Date   Hypertension    Stroke [facility])     There are no active problems to display for this patient.   Past Surgical History:  Procedure Laterality Date   ABDOMINAL HYSTERECTOMY       OB History   No obstetric history on file.      Home Medications    Prior to Admission medications   Medication Sig Start Date End Date Taking? Authorizing Provider  amLODipine (NORVASC) 10 MG tablet Take 10 mg by mouth daily. 08/01/19   [provider]  aspirin 81 MG chewable tablet Chew 81 mg by mouth.    [provider]  atorvastatin (LIPITOR) 20 MG tablet Take 20 mg by mouth daily. 08/15/19   [provider]  carvedilol (COREG) 12.5 MG tablet Take 12.5 mg by mouth 2 (two) times daily. 06/14/19   [provider]  furosemide (LASIX) 20 MG tablet Take 20 mg by mouth daily. 07/18/19   [provider]  irbesartan (AVAPRO) 150 MG tablet Take 300 mg by mouth daily. 06/02/19   [provider]  memantine (NAMENDA) 10 MG tablet Take 10 mg by mouth 2 (two) times daily. 07/17/19   [provider]  rivastigmine (EXELON) 1.5 MG capsule Take 1.5-3 mg by mouth See admin instructions. Take 1 tablet in the morning, take 2 tablets in the evening 07/25/19   [provider]    Family History Family History  Family history unknown: Yes    Social History Social History   Tobacco Use   Smoking status: Never Smoker   Smokeless tobacco: Never Used  Substance Use Topics   Alcohol use: Never    Frequency: Never   Drug use: Never     Allergies   Patient has no known allergies.   Review of Systems Review of Systems  All other systems reviewed and are negative.    Physical Exam Updated Vital Signs BP (!) 172/62 (BP Location: Left Arm)    Pulse 72    Temp 97.7 F (36.5 C) (Oral)    Resp 16    SpO2 100%   Physical Exam Vitals signs and nursing note reviewed.  Constitutional:      Appearance: She is well-developed.  HENT:     Head: Normocephalic.     Comments: There is a laceration above the left eyebrow. There is mild left Perriorbital ecchymosis and swelling. There is an abrasion over the left cheek. Pupils equal  round and reactive. EOM I. Cardiovascular:     Rate and Rhythm: Normal rate and regular rhythm.  Pulmonary:     Effort: Pulmonary effort is normal. No respiratory distress.  Abdominal:     Palpations: Abdomen is soft.     Tenderness: There is no abdominal tenderness. There is no guarding or rebound.  Musculoskeletal:        General: No tenderness.     Comments: Trace edema to bilateral lower extremities  Skin:    General: Skin is warm and dry.  Neurological:     Mental Status: She is alert.     Comments: Oriented to person in place. Disoriented to time. Five out of five strength in all four extremities with sensation to light touch intact in all four extremities  Psychiatric:        Behavior: Behavior normal.      ED  Treatments / Results  Labs (all labs ordered are listed, but only abnormal results are displayed) Labs Reviewed - No data to display  EKG None  Radiology Ct Head Wo Contrast  Result Date: 09/16/2019 CLINICAL DATA:  Witnessed fall with trauma to the face.  Dementia. EXAM: CT HEAD WITHOUT CONTRAST CT MAXILLOFACIAL WITHOUT CONTRAST CT CERVICAL SPINE WITHOUT CONTRAST TECHNIQUE: Multidetector CT imaging of the head, cervical spine, and maxillofacial structures were performed using the standard protocol without intravenous contrast. Multiplanar CT image reconstructions of the cervical spine and maxillofacial structures were also generated. COMPARISON:  08/18/2019 FINDINGS: CT HEAD FINDINGS Brain: Chronic generalized atrophy. Old infarction in the right parietooccipital junction. Chronic small-vessel ischemic changes of the cerebral hemispheric white matter. No sign of acute infarction, mass lesion, hemorrhage, hydrocephalus or extra-axial collection. Vascular: There is atherosclerotic calcification of the major vessels at the base of the brain. Skull: No skull fracture. Other: None CT MAXILLOFACIAL FINDINGS Osseous: Fracture of the anterior wall the maxillary sinus on the left, depressed 1 or 2 mm only. No displacement. Nasal fractures, nondisplaced. Orbits: No intraorbital injury. Periorbital soft tissue swelling on the left superficially. Sinuses: Small amount of layering fluid in the left maxillary sinus secondary to the anterior wall fracture. Soft tissues: Left periorbital soft tissue swelling and left cheek swelling. CT CERVICAL SPINE FINDINGS Alignment: Normal Skull base and vertebrae: No fracture or primary bone lesion. Soft tissues and spinal canal: Negative Disc levels: Ordinary degenerative spondylosis most pronounced at C3-4. Ordinary facet osteoarthritis. Upper chest: Negative Other: None IMPRESSION: Head CT: No acute or traumatic finding. Atrophy and chronic small-vessel disease. Old right  parieto-occipital infarction. Maxillofacial CT: Fracture of the anterior wall left maxillary sinus, depressed 1 or 2 mm only. Fluid layering in the left maxillary sinus. Nondisplaced nasal fractures. Cervical spine CT: No acute or traumatic finding. Chronic degenerative changes. Electronically Signed   By: Nelson Chimes M.D.   On: 09/16/2019 20:17   Ct Cervical Spine Wo Contrast  Result Date: 09/16/2019 CLINICAL DATA:  Witnessed fall with trauma to the face.  Dementia. EXAM: CT HEAD WITHOUT CONTRAST CT MAXILLOFACIAL WITHOUT CONTRAST CT CERVICAL SPINE WITHOUT CONTRAST TECHNIQUE: Multidetector CT imaging of the head, cervical spine, and maxillofacial structures were performed using the standard protocol without intravenous contrast. Multiplanar CT image reconstructions of the cervical spine and maxillofacial structures were also generated. COMPARISON:  08/18/2019 FINDINGS: CT HEAD FINDINGS Brain: Chronic generalized atrophy. Old infarction in the right parietooccipital junction. Chronic small-vessel ischemic changes of the cerebral hemispheric white matter. No sign of acute infarction, mass lesion, hemorrhage, hydrocephalus or extra-axial collection. Vascular: There  is atherosclerotic calcification of the major vessels at the base of the brain. Skull: No skull fracture. Other: None CT MAXILLOFACIAL FINDINGS Osseous: Fracture of the anterior wall the maxillary sinus on the left, depressed 1 or 2 mm only. No displacement. Nasal fractures, nondisplaced. Orbits: No intraorbital injury. Periorbital soft tissue swelling on the left superficially. Sinuses: Small amount of layering fluid in the left maxillary sinus secondary to the anterior wall fracture. Soft tissues: Left periorbital soft tissue swelling and left cheek swelling. CT CERVICAL SPINE FINDINGS Alignment: Normal Skull base and vertebrae: No fracture or primary bone lesion. Soft tissues and spinal canal: Negative Disc levels: Ordinary degenerative spondylosis  most pronounced at C3-4. Ordinary facet osteoarthritis. Upper chest: Negative Other: None IMPRESSION: Head CT: No acute or traumatic finding. Atrophy and chronic small-vessel disease. Old right parieto-occipital infarction. Maxillofacial CT: Fracture of the anterior wall left maxillary sinus, depressed 1 or 2 mm only. Fluid layering in the left maxillary sinus. Nondisplaced nasal fractures. Cervical spine CT: No acute or traumatic finding. Chronic degenerative changes. Electronically Signed   By: Nelson Chimes M.D.   On: 09/16/2019 20:17   Ct Maxillofacial Wo Cm  Result Date: 09/16/2019 CLINICAL DATA:  Witnessed fall with trauma to the face.  Dementia. EXAM: CT HEAD WITHOUT CONTRAST CT MAXILLOFACIAL WITHOUT CONTRAST CT CERVICAL SPINE WITHOUT CONTRAST TECHNIQUE: Multidetector CT imaging of the head, cervical spine, and maxillofacial structures were performed using the standard protocol without intravenous contrast. Multiplanar CT image reconstructions of the cervical spine and maxillofacial structures were also generated. COMPARISON:  08/18/2019 FINDINGS: CT HEAD FINDINGS Brain: Chronic generalized atrophy. Old infarction in the right parietooccipital junction. Chronic small-vessel ischemic changes of the cerebral hemispheric white matter. No sign of acute infarction, mass lesion, hemorrhage, hydrocephalus or extra-axial collection. Vascular: There is atherosclerotic calcification of the major vessels at the base of the brain. Skull: No skull fracture. Other: None CT MAXILLOFACIAL FINDINGS Osseous: Fracture of the anterior wall the maxillary sinus on the left, depressed 1 or 2 mm only. No displacement. Nasal fractures, nondisplaced. Orbits: No intraorbital injury. Periorbital soft tissue swelling on the left superficially. Sinuses: Small amount of layering fluid in the left maxillary sinus secondary to the anterior wall fracture. Soft tissues: Left periorbital soft tissue swelling and left cheek swelling. CT  CERVICAL SPINE FINDINGS Alignment: Normal Skull base and vertebrae: No fracture or primary bone lesion. Soft tissues and spinal canal: Negative Disc levels: Ordinary degenerative spondylosis most pronounced at C3-4. Ordinary facet osteoarthritis. Upper chest: Negative Other: None IMPRESSION: Head CT: No acute or traumatic finding. Atrophy and chronic small-vessel disease. Old right parieto-occipital infarction. Maxillofacial CT: Fracture of the anterior wall left maxillary sinus, depressed 1 or 2 mm only. Fluid layering in the left maxillary sinus. Nondisplaced nasal fractures. Cervical spine CT: No acute or traumatic finding. Chronic degenerative changes. Electronically Signed   By: Nelson Chimes M.D.   On: 09/16/2019 20:17    Procedures Procedures (including critical care time)  Medications Ordered in ED Medications  lidocaine-EPINEPHrine (XYLOCAINE W/EPI) 2 %-1:200000 (PF) injection 20 mL (20 mLs Infiltration Given 09/16/19 1740)     Initial Impression / Assessment and Plan / ED Course  I have reviewed the triage vital signs and the nursing notes.  Pertinent labs & imaging results that were available during my care of the patient were reviewed by me and considered in my medical decision making (see chart for details).        Patient here for evaluation of injuries following a  mechanical fall. She has facial laceration and pain to her left face, no additional complaints. Wound repaired per PA note. Imaging significant for maxillary and nasal fractures. Discussed with patient and husband home care for facial laceration, abrasion and maxillary fracture. Discussed outpatient follow-up and return precautions.  Final Clinical Impressions(s) / ED Diagnoses   Final diagnoses:  Fall, initial encounter  Facial laceration, initial encounter  Closed fracture of left side of maxilla, initial encounter [facility])    ED Discharge Orders    None       Quintella Reichert, MD 09/16/19 2121

## 2019-09-16 NOTE — ED Provider Notes (Signed)
..  Laceration Repair  Date/Time: 09/16/2019 6:44 PM Performed by: Carlisle Cater, PA-C Authorized by: Carlisle Cater, PA-C   Consent:    Consent obtained:  Verbal   Consent given by:  Patient   Risks discussed:  Infection and pain   Alternatives discussed:  No treatment Anesthesia (see MAR for exact dosages):    Anesthesia method:  Local infiltration   Local anesthetic:  Lidocaine 2% WITH epi Laceration details:    Location:  Face   Face location:  L eyebrow   Length (cm):  2 Pre-procedure details:    Preparation:  Patient was prepped and draped in usual sterile fashion and imaging obtained to evaluate for foreign bodies Exploration:    Hemostasis achieved with:  Epinephrine and direct pressure   Wound exploration: wound explored through full range of motion and entire depth of wound probed and visualized     Contaminated: no   Treatment:    Area cleansed with:  Saline   Amount of cleaning:  Standard Skin repair:    Repair method:  Sutures   Suture size:  5-0   Suture material:  Nylon   Suture technique:  Simple interrupted   Number of sutures:  5 Approximation:    Approximation:  Close Post-procedure details:    Dressing:  Bulky dressing   Patient tolerance of procedure:  Tolerated well, no immediate complications Comments:     Patient tolerated well.  Persistent venous ooze improved with epinephrine and wound closure.      Carlisle Cater, PA-C 09/16/19 1845    Quintella Reichert, MD 09/16/19 2121

## 2020-01-02 ENCOUNTER — Other Ambulatory Visit: Payer: Self-pay

## 2020-01-02 ENCOUNTER — Encounter (HOSPITAL_BASED_OUTPATIENT_CLINIC_OR_DEPARTMENT_OTHER): Payer: Self-pay | Admitting: *Deleted

## 2020-01-02 ENCOUNTER — Emergency Department (HOSPITAL_BASED_OUTPATIENT_CLINIC_OR_DEPARTMENT_OTHER)
Admission: EM | Admit: 2020-01-02 | Discharge: 2020-01-02 | Disposition: A | Discharge status: Home / Self Care | Payer: Medicare Other | Attending: Emergency Medicine | Admitting: Emergency Medicine

## 2020-01-02 DIAGNOSIS — I10 Essential (primary) hypertension: Secondary | ICD-10-CM | POA: Diagnosis not present

## 2020-01-02 DIAGNOSIS — Z79899 Other long term (current) drug therapy: Secondary | ICD-10-CM | POA: Diagnosis not present

## 2020-01-02 DIAGNOSIS — F0390 Unspecified dementia without behavioral disturbance: Secondary | ICD-10-CM | POA: Diagnosis not present

## 2020-01-02 DIAGNOSIS — Z8673 Personal history of transient ischemic attack (TIA), and cerebral infarction without residual deficits: Secondary | ICD-10-CM | POA: Insufficient documentation

## 2020-01-02 DIAGNOSIS — Z7982 Long term (current) use of aspirin: Secondary | ICD-10-CM | POA: Insufficient documentation

## 2020-01-02 LAB — BASIC METABOLIC PANEL
Anion gap: 12 (ref 5–15)
BUN: 28 mg/dL — ABNORMAL HIGH (ref 8–23)
CO2: 25 mmol/L (ref 22–32)
Calcium: 8.8 mg/dL — ABNORMAL LOW (ref 8.9–10.3)
Chloride: 106 mmol/L (ref 98–111)
Creatinine, Ser: 1.26 mg/dL — ABNORMAL HIGH (ref 0.44–1.00)
GFR calc Af Amer: 46 mL/min — ABNORMAL LOW (ref >60)
GFR calc non Af Amer: 39 mL/min — ABNORMAL LOW (ref >60)
Glucose, Bld: 128 mg/dL — ABNORMAL HIGH (ref 70–99)
Potassium: 3.9 mmol/L (ref 3.5–5.1)
Sodium: 143 mmol/L (ref 135–145)

## 2020-01-02 LAB — CBC WITH DIFFERENTIAL/PLATELET
Abs Immature Granulocytes: 0.01 10*3/uL (ref 0.00–0.07)
Basophils Absolute: 0 10*3/uL (ref 0.0–0.1)
Basophils Relative: 1 %
Eosinophils Absolute: 0.1 10*3/uL (ref 0.0–0.5)
Eosinophils Relative: 1 %
HCT: 39.1 % (ref 36.0–46.0)
Hemoglobin: 12.4 g/dL (ref 12.0–15.0)
Immature Granulocytes: 0 %
Lymphocytes Relative: 25 %
Lymphs Abs: 1.9 10*3/uL (ref 0.7–4.0)
MCH: 30.7 pg (ref 26.0–34.0)
MCHC: 31.7 g/dL (ref 30.0–36.0)
MCV: 96.8 fL (ref 80.0–100.0)
Monocytes Absolute: 0.8 10*3/uL (ref 0.1–1.0)
Monocytes Relative: 10 %
Neutro Abs: 4.9 10*3/uL (ref 1.7–7.7)
Neutrophils Relative %: 63 %
Platelets: 185 10*3/uL (ref 150–400)
RBC: 4.04 MIL/uL (ref 3.87–5.11)
RDW: 13.6 % (ref 11.5–15.5)
WBC: 7.8 10*3/uL (ref 4.0–10.5)
nRBC: 0 % (ref 0.0–0.2)

## 2020-01-02 NOTE — ED Provider Notes (Signed)
[facility] EMERGENCY DEPARTMENT Provider Note   CSN: [id number] Arrival date & time: 01/02/20  1900     History Chief Complaint  Patient presents with  . Hypertension    Tracy Valdez is a 86 y.o. female.  Patient is a 86 year old female who presents with elevated blood pressure.  She has a history of dementia, hypertension and prior stroke.  She lives in assisted living facility.  The caretaker there noted her blood pressure to be increasing throughout the afternoon.  Her daughter is at bedside and says that she has had a difficult time controlling her blood pressure over the last few months.  She is at her baseline mental status.  She is denying any chest pain or shortness of breath although history is limited due to her dementia.  She has been ambulating at her baseline and no change in mental status per daughter.        Past Medical History:  Diagnosis Date  . Hypertension   . Stroke [facility])     There are no problems to display for this patient.   Past Surgical History:  Procedure Laterality Date  . ABDOMINAL HYSTERECTOMY       OB History   No obstetric history on file.     Family History  Family history unknown: Yes    Social History   Tobacco Use  . Smoking status: Never Smoker  . Smokeless tobacco: Never Used  Substance Use Topics  . Alcohol use: Never  . Drug use: Never    Home Medications Prior to Admission medications   Medication Sig Start Date End Date Taking? Authorizing Provider  amLODipine (NORVASC) 10 MG tablet Take 10 mg by mouth daily. 08/01/19   [provider]  aspirin 81 MG chewable tablet Chew 81 mg by mouth.    [provider]  atorvastatin (LIPITOR) 20 MG tablet Take 20 mg by mouth daily. 08/15/19   [provider]  carvedilol (COREG) 12.5 MG tablet Take 12.5 mg by mouth 2 (two) times daily. 06/14/19   [provider]  furosemide (LASIX) 20 MG tablet Take 20 mg by mouth daily. 07/18/19    [provider]  irbesartan (AVAPRO) 150 MG tablet Take 300 mg by mouth daily. 06/02/19   [provider]  memantine (NAMENDA) 10 MG tablet Take 10 mg by mouth 2 (two) times daily. 07/17/19   [provider]  rivastigmine (EXELON) 1.5 MG capsule Take 1.5-3 mg by mouth See admin instructions. Take 1 tablet in the morning, take 2 tablets in the evening 07/25/19   [provider]    Allergies    Patient has no known allergies.  Review of Systems   Review of Systems  Unable to perform ROS: Dementia    Physical Exam Updated Vital Signs BP (!) 160/54 (BP Location: Right Arm)   Pulse 72   Temp 98.1 F (36.7 C) (Oral)   Resp 16   Ht 5' (1.524 m)   Wt 48.5 kg   SpO2 98%   BMI 20.90 kg/m   Physical Exam Constitutional:      Appearance: She is well-developed.  HENT:     Head: Normocephalic and atraumatic.  Eyes:     Pupils: Pupils are equal, round, and reactive to light.  Cardiovascular:     Rate and Rhythm: Normal rate and regular rhythm.     Heart sounds: Normal heart sounds.  Pulmonary:     Effort: Pulmonary effort is normal. No respiratory distress.  Breath sounds: Normal breath sounds. No wheezing or rales.  Chest:     Chest wall: No tenderness.  Abdominal:     General: Bowel sounds are normal.     Palpations: Abdomen is soft.     Tenderness: There is no abdominal tenderness. There is no guarding or rebound.  Musculoskeletal:        General: Normal range of motion.     Cervical back: Normal range of motion and neck supple.  Lymphadenopathy:     Cervical: No cervical adenopathy.  Skin:    General: Skin is warm and dry.     Findings: No rash.  Neurological:     Mental Status: She is alert.     Comments: Patient is conversive and oriented to person.  She states she is in [place] but cannot tell me the year.  She moves all extremities symmetrically without focal deficits.  She has 5 out of 5 strength in all extremities.  No  facial droop noted.  Sensation grossly intact in all extremities.     ED Results / Procedures / Treatments   Labs (all labs ordered are listed, but only abnormal results are displayed) Labs Reviewed  BASIC METABOLIC PANEL - Abnormal; Notable for the following components:      Result Value   Glucose, Bld 128 (*)    BUN 28 (*)    Creatinine, Ser 1.26 (*)    Calcium 8.8 (*)    GFR calc non Af Amer 39 (*)    GFR calc Af Amer 46 (*)    All other components within normal limits  CBC WITH DIFFERENTIAL/PLATELET    EKG EKG Interpretation  Date/Time:  Thursday January 02 2020 19:31:07 EST Ventricular Rate:  63 PR Interval:    QRS Duration: 108 QT Interval:  467 QTC Calculation: 479 R Axis:   76 Text Interpretation: Sinus rhythm Atrial premature complexes Inferior infarct, old Probable anterolateral infarct, old since last tracing no significant change Confirmed by Malvin Johns [phone]) on 01/02/2020 7:48:44 PM   Radiology No results found.  Procedures Procedures (including critical care time)  Medications Ordered in ED Medications - No data to display  ED Course  I have reviewed the triage vital signs and the nursing notes.  Pertinent labs & imaging results that were available during my care of the patient were reviewed by me and considered in my medical decision making (see chart for details).    MDM Rules/Calculators/A&P                      Patient presents with elevated blood pressures per family.  She currently denies any symptoms.  Her labs are nonconcerning.  Her creatinine is mildly elevated but similar to prior values.  She has no changes on her EKG.  The automatic cuff showed a blood pressure of 217/55 although we did multiple repeated manual blood pressures throughout her ED stay and they were much lower with blood pressures on discharge at 160/54.  Given this, I did not want to aggressively treat her blood pressure.  She is currently asymptomatic without neurologic  deficits.  No change in behavior which would warrant CT imaging.  She was discharged home in good condition.  The daughter was encouraged to have the patient follow-up with her PCP.  Return precautions were given. Final Clinical Impression(s) / ED Diagnoses Final diagnoses:  Essential hypertension    Rx / DC Orders ED Discharge Orders    None  Malvin Johns, MD 01/02/20 2050

## 2020-01-02 NOTE — ED Triage Notes (Signed)
She lives in an  Assisted living facility. Her daughter brought her here for c.o HTN. Pt refused to come to the hospital and told her daughter she feels fine. She is alert and oriented.

## 2020-03-21 ENCOUNTER — Emergency Department (HOSPITAL_BASED_OUTPATIENT_CLINIC_OR_DEPARTMENT_OTHER): Payer: Medicare Other

## 2020-03-21 ENCOUNTER — Emergency Department (HOSPITAL_BASED_OUTPATIENT_CLINIC_OR_DEPARTMENT_OTHER)
Admission: EM | Admit: 2020-03-21 | Discharge: 2020-03-21 | Disposition: A | Discharge status: Home / Self Care | Payer: Medicare Other | Attending: Emergency Medicine | Admitting: Emergency Medicine

## 2020-03-21 ENCOUNTER — Encounter (HOSPITAL_BASED_OUTPATIENT_CLINIC_OR_DEPARTMENT_OTHER): Payer: Self-pay | Admitting: Emergency Medicine

## 2020-03-21 ENCOUNTER — Other Ambulatory Visit: Payer: Self-pay

## 2020-03-21 DIAGNOSIS — I10 Essential (primary) hypertension: Secondary | ICD-10-CM | POA: Insufficient documentation

## 2020-03-21 DIAGNOSIS — F0390 Unspecified dementia without behavioral disturbance: Secondary | ICD-10-CM | POA: Insufficient documentation

## 2020-03-21 DIAGNOSIS — Z79899 Other long term (current) drug therapy: Secondary | ICD-10-CM | POA: Insufficient documentation

## 2020-03-21 DIAGNOSIS — M79605 Pain in left leg: Secondary | ICD-10-CM | POA: Insufficient documentation

## 2020-03-21 DIAGNOSIS — M79672 Pain in left foot: Secondary | ICD-10-CM

## 2020-03-21 DIAGNOSIS — M25572 Pain in left ankle and joints of left foot: Secondary | ICD-10-CM | POA: Diagnosis present

## 2020-03-21 HISTORY — DX: Unspecified dementia, unspecified severity, without behavioral disturbance, psychotic disturbance, mood disturbance, and anxiety: F03.90

## 2020-03-21 MED ORDER — ACETAMINOPHEN 325 MG PO TABS
650.0000 mg | ORAL_TABLET | Freq: Once | ORAL | Status: AC
  Administered 2020-03-21: 650 mg via ORAL
  Filled 2020-03-21: qty 2

## 2020-03-21 NOTE — Discharge Instructions (Addendum)
Her xray and ultrasound are reassuring today. Elevate her leg as much as possible. Follow closely with her primary care. Return to the ED if her foot becomes very cold and blue or for redness to her skin.

## 2020-03-21 NOTE — ED Provider Notes (Signed)
[facility] Provider Note   CSN: [id number] Arrival date & time: 03/21/20  1337     History Chief Complaint  Patient presents with   Foot Swelling    Tracy Valdez is a 86 y.o. female w PMHx w dementia, HTN, stroke, presenting to the ED with complaint of left foot swelling and pain.  Patient's daughter provides history as patient has dementia and cannot provide for full history.  She states she noticed the swelling on Thursday however today she began complaining of pain to the lower foot and was unable to bear weight due to pain.  No medications were used to treat her pain.  Patient daughter is unaware of any injuries.  Patient has no known history of DVT.  Not on anticoagulation.  Uses a wheelchair at baseline.  The history is provided by a relative. The history is limited by the condition of the patient.       Past Medical History:  Diagnosis Date   Dementia ([place])    Hypertension    Stroke ([place])     There are no problems to display for this patient.   Past Surgical History:  Procedure Laterality Date   ABDOMINAL HYSTERECTOMY       OB History   No obstetric history on file.     Family History  Family history unknown: Yes    Social History   Tobacco Use   Smoking status: Never Smoker   Smokeless tobacco: Never Used  Substance Use Topics   Alcohol use: Never   Drug use: Never    Home Medications Prior to Admission medications   Medication Sig Start Date End Date Taking? Authorizing Provider  amLODipine (NORVASC) 10 MG tablet Take 10 mg by mouth daily. 08/01/19   [provider]  aspirin 81 MG chewable tablet Chew 81 mg by mouth.    [provider]  atorvastatin (LIPITOR) 20 MG tablet Take 20 mg by mouth daily. 08/15/19   [provider]  carvedilol (COREG) 12.5 MG tablet Take 12.5 mg by mouth 2 (two) times daily. 06/14/19   [provider]  furosemide (LASIX) 20 MG tablet Take 20  mg by mouth daily. 07/18/19   [provider]  irbesartan (AVAPRO) 150 MG tablet Take 300 mg by mouth daily. 06/02/19   [provider]  memantine (NAMENDA) 10 MG tablet Take 10 mg by mouth 2 (two) times daily. 07/17/19   [provider]  rivastigmine (EXELON) 1.5 MG capsule Take 1.5-3 mg by mouth See admin instructions. Take 1 tablet in the morning, take 2 tablets in the evening 07/25/19   [provider]    Allergies    Patient has no known allergies.  Review of Systems   Review of Systems  Unable to perform ROS: Dementia    Physical Exam Updated Vital Signs BP (!) 146/32    Pulse 64    Temp 98.8 F (37.1 C) (Oral)    Resp 18    Ht 5\' 1"  (1.549 m)    Wt 48.5 kg    SpO2 96%    BMI 20.22 kg/m   Physical Exam Vitals and nursing note reviewed.  Constitutional:      General: She is not in acute distress.    Appearance: She is well-developed.  HENT:     Head: Normocephalic and atraumatic.  Eyes:     Conjunctiva/sclera: Conjunctivae normal.  Cardiovascular:     Rate and Rhythm: Normal rate and regular rhythm.  Heart sounds: Murmur present. Systolic murmur present. Diastolic murmur present.  Pulmonary:     Effort: Pulmonary effort is normal.  Abdominal:     Palpations: Abdomen is soft.  Musculoskeletal:     Comments: Left foot swelling which extends up the lower leg. No redness or warmth. TTP over lateral aspect of foot, inferior to lateral malleolus. Pain in the area with passive dorsiflexion of the foot.  Normal pulses and capillary refill. Knee and hip are benign on exam.  Skin:    General: Skin is warm.  Neurological:     Mental Status: She is alert.  Psychiatric:        Mood and Affect: Mood normal.        Behavior: Behavior normal.     ED Results / Procedures / Treatments   Labs (all labs ordered are listed, but only abnormal results are displayed) Labs Reviewed - No data to display  EKG None  Radiology DG Ankle Complete  Left  Result Date: 03/21/2020 CLINICAL DATA:  Ankle pain and swelling, no known injury EXAM: LEFT ANKLE COMPLETE - 3+ VIEW; LEFT FOOT - COMPLETE 3+ VIEW COMPARISON:  None. FINDINGS: Osteopenia. No fracture or dislocation of the left foot or left ankle. Mild ankle mortise and midfoot arthrosis. Diffuse soft tissue edema about the foot and ankle. Vascular calcinosis. IMPRESSION: Diffuse soft tissue edema about the foot and ankle. No fracture or dislocation. Electronically Signed   By: Eddie Candle M.D.   On: 03/21/2020 15:03   US Venous Img Lower Unilateral Left  Result Date: 03/21/2020 CLINICAL DATA:  Left ankle pain and swelling for 3 days EXAM: LEFT LOWER EXTREMITY VENOUS DOPPLER ULTRASOUND TECHNIQUE: Gray-scale sonography with graded compression, as well as color Doppler and duplex ultrasound were performed to evaluate the lower extremity deep venous systems from the level of the common femoral vein and including the common femoral, femoral, profunda femoral, popliteal and calf veins including the posterior tibial, peroneal and gastrocnemius veins when visible. The superficial great saphenous vein was also interrogated. Spectral Doppler was utilized to evaluate flow at rest and with distal augmentation maneuvers in the common femoral, femoral and popliteal veins. COMPARISON:  None. FINDINGS: Contralateral Common Femoral Vein: Respiratory phasicity is normal and symmetric with the symptomatic side. No evidence of thrombus. Normal compressibility. Common Femoral Vein: No evidence of thrombus. Normal compressibility, respiratory phasicity and response to augmentation. Saphenofemoral Junction: No evidence of thrombus. Normal compressibility and flow on color Doppler imaging. Profunda Femoral Vein: No evidence of thrombus. Normal compressibility and flow on color Doppler imaging. Femoral Vein: No evidence of thrombus. Normal compressibility, respiratory phasicity and response to augmentation. Popliteal Vein: No  evidence of thrombus. Normal compressibility, respiratory phasicity and response to augmentation. Calf Veins: No evidence of thrombus. Normal compressibility and flow on color Doppler imaging. Superficial Great Saphenous Vein: No evidence of thrombus. Normal compressibility. Venous Reflux:  None. Other Findings: Complex fluid collection in the medial popliteal fossa is noted measuring 4.7 x 1.9 x 3.1 cm consistent with complex popliteal cyst. IMPRESSION: No evidence of deep venous thrombosis. Complex popliteal cyst. Electronically Signed   By: Inez Catalina M.D.   On: 03/21/2020 16:33   DG Foot Complete Left  Result Date: 03/21/2020 CLINICAL DATA:  Ankle pain and swelling, no known injury EXAM: LEFT ANKLE COMPLETE - 3+ VIEW; LEFT FOOT - COMPLETE 3+ VIEW COMPARISON:  None. FINDINGS: Osteopenia. No fracture or dislocation of the left foot or left ankle. Mild ankle mortise and midfoot arthrosis. Diffuse  soft tissue edema about the foot and ankle. Vascular calcinosis. IMPRESSION: Diffuse soft tissue edema about the foot and ankle. No fracture or dislocation. Electronically Signed   By: Eddie Candle M.D.   On: 03/21/2020 15:03    Procedures Procedures (including critical care time)  Medications Ordered in ED Medications  acetaminophen (TYLENOL) tablet 650 mg (650 mg Oral Given 03/21/20 1458)    ED Course  I have reviewed the triage vital signs and the nursing notes.  Pertinent labs & imaging results that were available during my care of the patient were reviewed by me and considered in my medical decision making (see chart for details).    MDM Rules/Calculators/A&P                      Patient with history of dementia, brought in by her daughter for left ankle/foot pain and swelling that began a few days ago.  She states initially her foot and ankle were swollen, however today she refused to put any weight on it was complaining of some pain.  On exam she has tenderness just inferior to the lateral  malleolus with some slight bruising present.  Pain is reproducible in this area with passive dorsi flexion.  The swelling does somewhat extend up the calf though there is no tenderness here.  Normal pulses and capillary refill.  X-ray is negative for acute fracture.  Proceeded with venous ultrasound to rule out DVT, this is also negative for DVT.  Patient's ankle placed in ASO brace for support.  Encouraged to follow closely with PCP.  Discussed reasons to return to the ED, including redness or warmth concerning for infection, or signs of decreased blood supply to the foot.  Safe for discharge.  Evaluated by Dr. Roslynn Amble, who agrees with work-up and care plan.  Discussed results, findings, treatment and follow up. Patient advised of return precautions. Patient's daughter verbalized understanding and agreed with plan.  Final Clinical Impression(s) / ED Diagnoses Final diagnoses:  Acute left ankle pain    Rx / DC Orders ED Discharge Orders    None       Juleah Paradise, [place] N, PA-C 03/21/20 1716    Lucrezia Starch, MD 03/22/20 [phone]

## 2020-03-21 NOTE — ED Triage Notes (Signed)
L foot pain and swelling since Thursday. Denies injury.

## 2020-08-17 ENCOUNTER — Inpatient Hospital Stay (HOSPITAL_COMMUNITY)
Admission: EM | Admit: 2020-08-17 | Discharge: 2020-08-22 | DRG: 291 | Disposition: A | Discharge status: Hospice | Payer: Medicare Other | Source: Skilled Nursing Facility | Attending: Internal Medicine | Admitting: Internal Medicine

## 2020-08-17 ENCOUNTER — Emergency Department (HOSPITAL_COMMUNITY): Payer: Medicare Other

## 2020-08-17 ENCOUNTER — Encounter (HOSPITAL_COMMUNITY): Payer: Self-pay | Admitting: Emergency Medicine

## 2020-08-17 ENCOUNTER — Inpatient Hospital Stay (HOSPITAL_COMMUNITY): Payer: Medicare Other

## 2020-08-17 DIAGNOSIS — I130 Hypertensive heart and chronic kidney disease with heart failure and stage 1 through stage 4 chronic kidney disease, or unspecified chronic kidney disease: Principal | ICD-10-CM | POA: Diagnosis present

## 2020-08-17 DIAGNOSIS — I313 Pericardial effusion (noninflammatory): Secondary | ICD-10-CM | POA: Diagnosis present

## 2020-08-17 DIAGNOSIS — Z6821 Body mass index (BMI) 21.0-21.9, adult: Secondary | ICD-10-CM | POA: Diagnosis not present

## 2020-08-17 DIAGNOSIS — R001 Bradycardia, unspecified: Secondary | ICD-10-CM | POA: Diagnosis present

## 2020-08-17 DIAGNOSIS — Z20822 Contact with and (suspected) exposure to covid-19: Secondary | ICD-10-CM | POA: Diagnosis present

## 2020-08-17 DIAGNOSIS — Z515 Encounter for palliative care: Secondary | ICD-10-CM

## 2020-08-17 DIAGNOSIS — Z993 Dependence on wheelchair: Secondary | ICD-10-CM | POA: Diagnosis not present

## 2020-08-17 DIAGNOSIS — I5031 Acute diastolic (congestive) heart failure: Secondary | ICD-10-CM | POA: Insufficient documentation

## 2020-08-17 DIAGNOSIS — F0390 Unspecified dementia without behavioral disturbance: Secondary | ICD-10-CM | POA: Diagnosis present

## 2020-08-17 DIAGNOSIS — I509 Heart failure, unspecified: Secondary | ICD-10-CM

## 2020-08-17 DIAGNOSIS — R609 Edema, unspecified: Secondary | ICD-10-CM | POA: Diagnosis not present

## 2020-08-17 DIAGNOSIS — J9601 Acute respiratory failure with hypoxia: Secondary | ICD-10-CM | POA: Diagnosis present

## 2020-08-17 DIAGNOSIS — R296 Repeated falls: Secondary | ICD-10-CM | POA: Diagnosis present

## 2020-08-17 DIAGNOSIS — Z90710 Acquired absence of both cervix and uterus: Secondary | ICD-10-CM | POA: Diagnosis not present

## 2020-08-17 DIAGNOSIS — I161 Hypertensive emergency: Secondary | ICD-10-CM | POA: Diagnosis present

## 2020-08-17 DIAGNOSIS — D631 Anemia in chronic kidney disease: Secondary | ICD-10-CM | POA: Diagnosis present

## 2020-08-17 DIAGNOSIS — I495 Sick sinus syndrome: Secondary | ICD-10-CM | POA: Diagnosis not present

## 2020-08-17 DIAGNOSIS — R5381 Other malaise: Secondary | ICD-10-CM | POA: Diagnosis present

## 2020-08-17 DIAGNOSIS — Z86718 Personal history of other venous thrombosis and embolism: Secondary | ICD-10-CM | POA: Diagnosis not present

## 2020-08-17 DIAGNOSIS — I10 Essential (primary) hypertension: Secondary | ICD-10-CM | POA: Diagnosis not present

## 2020-08-17 DIAGNOSIS — N182 Chronic kidney disease, stage 2 (mild): Secondary | ICD-10-CM | POA: Diagnosis present

## 2020-08-17 DIAGNOSIS — N184 Chronic kidney disease, stage 4 (severe): Secondary | ICD-10-CM | POA: Diagnosis not present

## 2020-08-17 DIAGNOSIS — L89220 Pressure ulcer of left hip, unstageable: Secondary | ICD-10-CM | POA: Diagnosis present

## 2020-08-17 DIAGNOSIS — I5033 Acute on chronic diastolic (congestive) heart failure: Secondary | ICD-10-CM | POA: Diagnosis present

## 2020-08-17 DIAGNOSIS — R627 Adult failure to thrive: Secondary | ICD-10-CM | POA: Diagnosis present

## 2020-08-17 DIAGNOSIS — R9431 Abnormal electrocardiogram [ECG] [EKG]: Secondary | ICD-10-CM | POA: Diagnosis present

## 2020-08-17 DIAGNOSIS — I351 Nonrheumatic aortic (valve) insufficiency: Secondary | ICD-10-CM | POA: Diagnosis present

## 2020-08-17 DIAGNOSIS — I455 Other specified heart block: Secondary | ICD-10-CM | POA: Diagnosis present

## 2020-08-17 DIAGNOSIS — I69354 Hemiplegia and hemiparesis following cerebral infarction affecting left non-dominant side: Secondary | ICD-10-CM

## 2020-08-17 DIAGNOSIS — Z79899 Other long term (current) drug therapy: Secondary | ICD-10-CM

## 2020-08-17 DIAGNOSIS — Z66 Do not resuscitate: Secondary | ICD-10-CM | POA: Diagnosis not present

## 2020-08-17 DIAGNOSIS — L97529 Non-pressure chronic ulcer of other part of left foot with unspecified severity: Secondary | ICD-10-CM | POA: Diagnosis present

## 2020-08-17 DIAGNOSIS — I441 Atrioventricular block, second degree: Secondary | ICD-10-CM | POA: Diagnosis present

## 2020-08-17 DIAGNOSIS — R52 Pain, unspecified: Secondary | ICD-10-CM

## 2020-08-17 HISTORY — DX: Pericardial effusion (noninflammatory): I31.3

## 2020-08-17 HISTORY — DX: Chronic kidney disease, stage 3a: N18.31

## 2020-08-17 HISTORY — DX: Nonrheumatic aortic (valve) insufficiency: I35.1

## 2020-08-17 HISTORY — DX: Other pericardial effusion (noninflammatory): I31.39

## 2020-08-17 HISTORY — DX: Acute embolism and thrombosis of unspecified deep veins of unspecified lower extremity: I82.409

## 2020-08-17 LAB — RESPIRATORY PANEL BY RT PCR (FLU A&B, COVID)
Influenza A by PCR: NEGATIVE
Influenza B by PCR: NEGATIVE
SARS Coronavirus 2 by RT PCR: NEGATIVE

## 2020-08-17 LAB — CBC
HCT: 32.9 % — ABNORMAL LOW (ref 36.0–46.0)
Hemoglobin: 10 g/dL — ABNORMAL LOW (ref 12.0–15.0)
MCH: 29.9 pg (ref 26.0–34.0)
MCHC: 30.4 g/dL (ref 30.0–36.0)
MCV: 98.2 fL (ref 80.0–100.0)
Platelets: 282 10*3/uL (ref 150–400)
RBC: 3.35 MIL/uL — ABNORMAL LOW (ref 3.87–5.11)
RDW: 13.4 % (ref 11.5–15.5)
WBC: 9.5 10*3/uL (ref 4.0–10.5)
nRBC: 0 % (ref 0.0–0.2)

## 2020-08-17 LAB — BRAIN NATRIURETIC PEPTIDE: B Natriuretic Peptide: 3923.3 pg/mL — ABNORMAL HIGH (ref 0.0–100.0)

## 2020-08-17 LAB — BASIC METABOLIC PANEL
Anion gap: 10 (ref 5–15)
BUN: 22 mg/dL (ref 8–23)
CO2: 21 mmol/L — ABNORMAL LOW (ref 22–32)
Calcium: 7.8 mg/dL — ABNORMAL LOW (ref 8.9–10.3)
Chloride: 106 mmol/L (ref 98–111)
Creatinine, Ser: 1.1 mg/dL — ABNORMAL HIGH (ref 0.44–1.00)
GFR, Estimated: 46 mL/min — ABNORMAL LOW (ref >60)
Glucose, Bld: 139 mg/dL — ABNORMAL HIGH (ref 70–99)
Potassium: 4.2 mmol/L (ref 3.5–5.1)
Sodium: 137 mmol/L (ref 135–145)

## 2020-08-17 LAB — TROPONIN I (HIGH SENSITIVITY)
Troponin I (High Sensitivity): 83 ng/L — ABNORMAL HIGH (ref <18)
Troponin I (High Sensitivity): 93 ng/L — ABNORMAL HIGH (ref <18)

## 2020-08-17 LAB — ECHOCARDIOGRAM COMPLETE: S' Lateral: 4.3 cm

## 2020-08-17 MED ORDER — ACETAMINOPHEN 325 MG PO TABS
650.0000 mg | ORAL_TABLET | ORAL | Status: DC | PRN
  Administered 2020-08-17 – 2020-08-20 (×5): 650 mg via ORAL
  Filled 2020-08-17 (×5): qty 2

## 2020-08-17 MED ORDER — FUROSEMIDE 10 MG/ML IJ SOLN
40.0000 mg | Freq: Once | INTRAMUSCULAR | Status: AC
  Administered 2020-08-17: 40 mg via INTRAVENOUS
  Filled 2020-08-17: qty 4

## 2020-08-17 MED ORDER — SODIUM CHLORIDE 0.9% FLUSH
3.0000 mL | Freq: Two times a day (BID) | INTRAVENOUS | Status: DC
  Administered 2020-08-18 – 2020-08-21 (×5): 3 mL via INTRAVENOUS

## 2020-08-17 MED ORDER — IRBESARTAN 300 MG PO TABS
150.0000 mg | ORAL_TABLET | Freq: Every day | ORAL | Status: DC
  Administered 2020-08-17 – 2020-08-19 (×3): 150 mg via ORAL
  Filled 2020-08-17 (×3): qty 1

## 2020-08-17 MED ORDER — ONDANSETRON HCL 4 MG/2ML IJ SOLN: 4.0000 mg | Freq: Four times a day (QID) | INTRAMUSCULAR | Status: DC | PRN

## 2020-08-17 MED ORDER — HYDRALAZINE HCL 50 MG PO TABS
100.0000 mg | ORAL_TABLET | Freq: Three times a day (TID) | ORAL | Status: DC
  Administered 2020-08-17 – 2020-08-20 (×8): 100 mg via ORAL
  Filled 2020-08-17 (×8): qty 2

## 2020-08-17 MED ORDER — ATORVASTATIN CALCIUM 10 MG PO TABS
20.0000 mg | ORAL_TABLET | Freq: Every day | ORAL | Status: DC
  Administered 2020-08-18 – 2020-08-20 (×3): 20 mg via ORAL
  Filled 2020-08-17 (×3): qty 2

## 2020-08-17 MED ORDER — ISOSORBIDE MONONITRATE ER 30 MG PO TB24: 30.0000 mg | ORAL_TABLET | Freq: Every day | ORAL | Status: DC

## 2020-08-17 MED ORDER — HYDRALAZINE HCL 20 MG/ML IJ SOLN: 2.0000 mg | Freq: Four times a day (QID) | INTRAMUSCULAR | Status: DC | PRN

## 2020-08-17 MED ORDER — ACETAMINOPHEN 500 MG PO TABS: 1000.0000 mg | ORAL_TABLET | Freq: Three times a day (TID) | ORAL | Status: DC | PRN

## 2020-08-17 MED ORDER — SODIUM CHLORIDE 0.9 % IV SOLN: 250.0000 mL | INTRAVENOUS | Status: DC | PRN

## 2020-08-17 MED ORDER — HYDRALAZINE HCL 50 MG PO TABS: 75.0000 mg | ORAL_TABLET | Freq: Three times a day (TID) | ORAL | Status: DC

## 2020-08-17 MED ORDER — FUROSEMIDE 10 MG/ML IJ SOLN
40.0000 mg | Freq: Every day | INTRAMUSCULAR | Status: DC
  Administered 2020-08-18 – 2020-08-19 (×2): 40 mg via INTRAVENOUS
  Filled 2020-08-17 (×2): qty 4

## 2020-08-17 MED ORDER — ENOXAPARIN SODIUM 40 MG/0.4ML ~~LOC~~ SOLN: 40.0000 mg | SUBCUTANEOUS | Status: DC

## 2020-08-17 MED ORDER — CARVEDILOL 3.125 MG PO TABS
12.5000 mg | ORAL_TABLET | Freq: Two times a day (BID) | ORAL | Status: DC
  Administered 2020-08-17: 12.5 mg via ORAL
  Filled 2020-08-17: qty 4

## 2020-08-17 MED ORDER — VITAMIN B-12 100 MCG PO TABS
100.0000 ug | ORAL_TABLET | Freq: Every day | ORAL | Status: DC
  Administered 2020-08-17 – 2020-08-20 (×4): 100 ug via ORAL
  Filled 2020-08-17 (×4): qty 1

## 2020-08-17 MED ORDER — SODIUM CHLORIDE 0.9% FLUSH: 3.0000 mL | INTRAVENOUS | Status: DC | PRN

## 2020-08-17 MED ORDER — RIVAROXABAN 15 MG PO TABS
15.0000 mg | ORAL_TABLET | Freq: Every day | ORAL | Status: DC
  Administered 2020-08-17 – 2020-08-18 (×2): 15 mg via ORAL
  Filled 2020-08-17 (×2): qty 1

## 2020-08-17 MED ORDER — MEMANTINE HCL 10 MG PO TABS
10.0000 mg | ORAL_TABLET | Freq: Every day | ORAL | Status: DC
  Administered 2020-08-18 – 2020-08-20 (×3): 10 mg via ORAL
  Filled 2020-08-17 (×3): qty 1

## 2020-08-17 MED ORDER — FERROUS SULFATE 325 (65 FE) MG PO TABS
325.0000 mg | ORAL_TABLET | Freq: Every day | ORAL | Status: DC
  Administered 2020-08-18 – 2020-08-20 (×3): 325 mg via ORAL
  Filled 2020-08-17 (×3): qty 1

## 2020-08-17 NOTE — ED Triage Notes (Signed)
Pt here from [facility]  Senior  living with c/o swelling and sats of 88 to 90 %  Placed on 2 liters by ems sats up to 99 % ,

## 2020-08-17 NOTE — Progress Notes (Signed)
  Echocardiogram 2D Echocardiogram has been performed.  Tracy Valdez 08/17/2020, 4:22 PM

## 2020-08-17 NOTE — ED Provider Notes (Addendum)
[facility] EMERGENCY DEPARTMENT Provider Note   CSN: [id number] Arrival date & time: 08/17/20  1112     History Chief Complaint  Patient presents with   Leg Swelling    Tracy Valdez is a 86 y.o. female history of dementia, hypertension, CVA, aortic insufficiency.  Patient presents today from [place] facility for concern of extremity edema and hypoxia.  History obtained from patient's RN Mervin Kung, [phone].  Over the past 6 days patient has had increasing bilateral lower extremity edema, they have doubled her p.o. Lasix from 20 mg daily to 40 mg without improvement of her symptoms.  They obtained a chest x-ray 5 days ago which showed fluid overload.  They report patient has gained 5 pounds in the last 1 week.  Today the nursing home physician evaluated the patient and patient's O2 saturations were in the eighties and EMS was called during patient into the ED.  Additionally they report patient had a 1 day fever of 102 F 6 days ago, patient had been started on Augmentin twice daily at that time and has not had any fever since then.  Patient has history of mention normally only alert and oriented to self, no change in mentation.  No recent falls, no blood thinner use.  Advises that the right hip appears deformed from a fall several months ago but there is no fracture there.  Patient is wheelchair-bound.  Level 5 caveat dementia - Additional history obtained by EMS, on their arrival patient with SPO2 saturation around 86% this improved to 99% on 2 L nasal cannula.  HPI     Past Medical History:  Diagnosis Date   Dementia ([place])    Hypertension    Stroke ([place])     There are no problems to display for this patient.   Past Surgical History:  Procedure Laterality Date   ABDOMINAL HYSTERECTOMY       OB History   No obstetric history on file.     Family History  Family history unknown: Yes    Social History   Tobacco Use   Smoking  status: Never Smoker   Smokeless tobacco: Never Used  Vaping Use   Vaping Use: Never used  Substance Use Topics   Alcohol use: Never   Drug use: Never    Home Medications Prior to Admission medications   Medication Sig Start Date End Date Taking? Authorizing Provider  amLODipine (NORVASC) 10 MG tablet Take 10 mg by mouth daily. 08/01/19   [provider]  aspirin 81 MG chewable tablet Chew 81 mg by mouth.    [provider]  atorvastatin (LIPITOR) 20 MG tablet Take 20 mg by mouth daily. 08/15/19   [provider]  carvedilol (COREG) 12.5 MG tablet Take 12.5 mg by mouth 2 (two) times daily. 06/14/19   [provider]  furosemide (LASIX) 20 MG tablet Take 20 mg by mouth daily. 07/18/19   [provider]  irbesartan (AVAPRO) 150 MG tablet Take 300 mg by mouth daily. 06/02/19   [provider]  memantine (NAMENDA) 10 MG tablet Take 10 mg by mouth 2 (two) times daily. 07/17/19   [provider]  rivastigmine (EXELON) 1.5 MG capsule Take 1.5-3 mg by mouth See admin instructions. Take 1 tablet in the morning, take 2 tablets in the evening 07/25/19   [provider]    Allergies    Patient has no known allergies.  Review of Systems   Review of Systems  Unable  to perform ROS: Dementia    Physical Exam Updated Vital Signs BP (!) 170/44 (BP Location: Right Arm)    Pulse 70    Temp 99.5 F (37.5 C) (Oral)    Resp 16    SpO2 93%   Physical Exam Constitutional:      General: She is not in acute distress.    Appearance: Normal appearance. She is well-developed. She is not ill-appearing or diaphoretic.  HENT:     Head: Normocephalic and atraumatic.  Eyes:     General: Vision grossly intact. Gaze aligned appropriately.     Extraocular Movements: Extraocular movements intact.     Pupils: Pupils are equal, round, and reactive to light.  Neck:     Trachea: Trachea and phonation normal. No tracheal tenderness or tracheal  deviation.  Cardiovascular:     Rate and Rhythm: Normal rate. Rhythm irregularly irregular.     Pulses:          Dorsalis pedis pulses are 1+ on the right side and 1+ on the left side.  Pulmonary:     Effort: Pulmonary effort is normal. No tachypnea, accessory muscle usage or respiratory distress.     Breath sounds: Normal air entry. Decreased breath sounds present.  Chest:     Chest wall: No tenderness.  Abdominal:     General: There is no distension.     Palpations: Abdomen is soft.     Tenderness: There is no abdominal tenderness. There is no guarding or rebound.  Musculoskeletal:        General: Normal range of motion.     Cervical back: Normal range of motion. No spinous process tenderness or muscular tenderness.     Right lower leg: 3+ Edema present.     Left lower leg: 3+ Edema present.     Comments: No midline C/T/L spinal tenderness to palpation, no paraspinal muscle tenderness, no deformity, crepitus, or step-off noted. No sign of injury to the neck or back. - Right lower extremity appears somewhat turned inward, no leg length discrepancy, right hip appears bowed out compared to left patient unable to tell if this is new or not.  Appropriate range of motion of all major joints for age.  Multiple old bruises present.  Feet:     Right foot:     Protective Sensation: 3 sites tested. 3 sites sensed.     Left foot:     Protective Sensation: 3 sites tested. 3 sites sensed.  Skin:    General: Skin is warm and dry.  Neurological:     Mental Status: She is alert.     GCS: GCS eye subscore is 4. GCS verbal subscore is 5. GCS motor subscore is 6.     Comments: Speech is clear and goal oriented, follows commands Major Cranial nerves without deficit, no facial droop Moves extremities without ataxia, coordination intact  Psychiatric:        Behavior: Behavior normal.     ED Results / Procedures / Treatments   Labs (all labs ordered are listed, but only abnormal results are  displayed) Labs Reviewed  BASIC METABOLIC PANEL - Abnormal; Notable for the following components:      Result Value   CO2 21 (*)    Glucose, Bld 139 (*)    Creatinine, Ser 1.10 (*)    Calcium 7.8 (*)    GFR, Estimated 46 (*)    All other components within normal limits  CBC - Abnormal; Notable for the following components:  RBC 3.35 (*)    Hemoglobin 10.0 (*)    HCT 32.9 (*)    All other components within normal limits  BRAIN NATRIURETIC PEPTIDE - Abnormal; Notable for the following components:   B Natriuretic Peptide 3,923.3 (*)    All other components within normal limits  TROPONIN I (HIGH SENSITIVITY) - Abnormal; Notable for the following components:   Troponin I (High Sensitivity) 83 (*)    All other components within normal limits  TROPONIN I (HIGH SENSITIVITY) - Abnormal; Notable for the following components:   Troponin I (High Sensitivity) 93 (*)    All other components within normal limits  RESPIRATORY PANEL BY RT PCR (FLU A&B, COVID)    EKG EKG Interpretation  Date/Time:  Monday August 17 2020 11:26:50 EDT Ventricular Rate:  85 PR Interval:    QRS Duration: 111 QT Interval:  442 QTC Calculation: 523 R Axis:   84 Text Interpretation: Atrial fibrillation Inferoposterior infarct, recent Abnormal lateral Q waves Abnormal T, consider ischemia, anterior leads Prolonged QT interval No significant change since last tracing Confirmed by Calvert Cantor [phone]) on 08/17/2020 11:34:19 AM   Radiology DG Chest 2 View  Result Date: 08/17/2020 CLINICAL DATA:  Status post fall. EXAM: CHEST - 2 VIEW COMPARISON:  August 18, 2019. FINDINGS: Stable cardiomegaly. No pneumothorax is noted. Mild left pleural effusion is noted with probable associated left basilar atelectasis or infiltrate. Central pulmonary vascular congestion is noted with possible bilateral perihilar edema. Bony thorax is unremarkable. IMPRESSION: Mild left pleural effusion with probable associated left basilar  atelectasis or infiltrate. Central pulmonary vascular congestion with possible bilateral perihilar edema. Aortic Atherosclerosis (ICD10-I70.0). Electronically Signed   By: Marijo Conception M.D.   On: 08/17/2020 12:19   DG Pelvis 1-2 Views  Result Date: 08/17/2020 CLINICAL DATA:  Fall. EXAM: PELVIS - 1-2 VIEW COMPARISON:  August 18, 2019. FINDINGS: There is no evidence of pelvic fracture or diastasis. No pelvic bone lesions are seen. IMPRESSION: Negative. Electronically Signed   By: Marijo Conception M.D.   On: 08/17/2020 12:21   DG FEMUR, MIN 2 VIEWS RIGHT  Result Date: 08/17/2020 CLINICAL DATA:  Fall. EXAM: RIGHT FEMUR 2 VIEWS COMPARISON:  None. FINDINGS: There is no evidence of fracture or other focal bone lesions. Soft tissues are unremarkable. IMPRESSION: Negative. Electronically Signed   By: Marijo Conception M.D.   On: 08/17/2020 12:22    Procedures .Critical Care Performed by: Deliah Boston, PA-C Authorized by: Deliah Boston, PA-C   Critical care provider statement:    Critical care time (minutes):  32   Critical care was necessary to treat or prevent imminent or life-threatening deterioration of the following conditions:  Respiratory failure   Critical care was time spent personally by me on the following activities:  Discussions with consultants, evaluation of patient's response to treatment, examination of patient, ordering and performing treatments and interventions, ordering and review of laboratory studies, ordering and review of radiographic studies, pulse oximetry, re-evaluation of patient's condition, obtaining history from patient or surrogate, review of old charts and development of treatment plan with patient or surrogate   (including critical care time)  Medications Ordered in ED Medications  furosemide (LASIX) injection 40 mg (has no administration in time range)    ED Course  I have reviewed the triage vital signs and the nursing notes.  Pertinent labs &  imaging results that were available during my care of the patient were reviewed by me and considered in my medical  decision making (see chart for details).    MDM Rules/Calculators/A&P                         Additional history obtained from: 1. Nursing notes from this visit. 2. EMS. 3. Nursing facility staff. -------------------------- 86 year old female arrives from nursing facility for fluid overload and hypoxia.  She recently had increase of her Lasix over the past 5 days but has still gained 5 pounds.  She does have a history of a fever around 5 or 6 days ago but has been on Augmentin twice daily since that time.  She has no history of productive cough nausea vomiting or other infectious type symptoms.  She denies any chest pain or shortness of breath on exam.  She has dementia but baseline mental status.  On initial exam there did appear to be a deformity and some internal rotation of her right leg so x-ray of the pelvis and right femur were ordered.  After these were obtained I was able to contact nursing facility staff who reported right hip is an old injury from a fall several months ago.  Basic blood work as well as a BNP troponin and chest x-ray were ordered.  Anticipate admission given oxygen requirement. - I ordered, reviewed and interpreted labs which include: CBC shows mild anemia of 10.0, no leukocytosis to suggest infection. Covid/influenza panel negative. BMP shows baseline creatinine of 1.1, no emergent electrolyte derangement, AKI or gap. High-sensitivity troponin of 93, suspect secondary to CHF exacerbation. BNP 3923, supportive of CHF exacerbation.  Chest x-ray:  IMPRESSION:  Mild left pleural effusion with probable associated left basilar  atelectasis or infiltrate. Central pulmonary vascular congestion  with possible bilateral perihilar edema.    Aortic Atherosclerosis (ICD10-I70.0).   DG Pelvis:  IMPRESSION:  Negative.   DG Right Femur:  IMPRESSION:    Negative.   EKG: Atrial fibrillation Inferoposterior infarct, recent Abnormal lateral Q waves Abnormal T, consider ischemia, anterior leads Prolonged QT interval No significant change since last tracing Confirmed by Calvert Cantor [phone]) on 08/17/2020 11:34:19 AM ------------ Based on history physical examination and work-up above I have low suspicion for infection at this time suspect patient with CHF exacerbation.  Do not feel patient would benefit from antibiotics at this point.  40 mg IV Lasix ordered for treatment of CHF.  Patient seen and evaluated by Dr. Karle Starch during this visit agrees with admission. - Patient reassessed she is resting comfortably in bed no acute distress husband at bedside.  They are agreeable for admission, no further questions or concerns. Patient remains pain-free reports she is feeling well. - 2:04 PM: Discussed case with Dr. Roosevelt Locks, patient accepted to hospitalist service. ================= Addendum 6:55 PM: During changing a skin ulcer nursing staff noticed bedsore of the left hip.  I reevaluated this patient she has approximately, moderate sized bedsore present with eschar, no significant surrounding cellulitis or streaking.  X-rays of the left hip earlier were negative for osteomyelitis.  I updated Dr. Roosevelt Locks at 6:58 PM, he is aware of the skin breakdown.  Note: Portions of this report may have been transcribed using voice recognition software. Every effort was made to ensure accuracy; however, inadvertent computerized transcription errors may still be present. Final Clinical Impression(s) / ED Diagnoses Final diagnoses:  Acute on chronic congestive heart failure, unspecified heart failure type ([place])  Acute respiratory failure with hypoxia ([place])    Rx / DC Orders ED Discharge Orders    None  Gari Crown 08/17/20 1407    Truddie Hidden, MD 08/17/20 [place], PA-C 08/17/20 1900    Truddie Hidden,  MD 08/17/20 [phone]

## 2020-08-17 NOTE — H&P (Signed)
History and Physical    Tracy Valdez:[id number] DOB: 07-12-1934 DOA: 08/17/2020  PCP: Curly Rim, MD (Confirm with patient/family/NH records and if not entered, this has to be entered at [facility] point of entry) Patient coming from: Nursing home  I have personally briefly reviewed patient's old medical records in [place]  Chief Complaint: SOB, leg swelling  HPI: Tracy Valdez is a 86 y.o. female with medical history significant of refractory hypertension, diastolic CHF, DVT off anticoagulation, CKD stage II, stroke with left-sided weakness and chronic ambulation dysfunction, dementia, presented with increasing short of breath and leg swelling for 1 month.  Patient is a nursing home resident for last 4+ months for worsening of ambulation function and has been wheelchair-bound since summer this year.  Patient is baseline demented, most of the history provided by husband at bedside.  According to outpatient records patient has had refractory hypertension and been following with [facility] cardiologist who has been adjusting patient's blood pressure meds several times this year.  Patient started to have increasing leg swelling and shortness of breath for 3 to 4 weeks, since last week nursing home has been increasing patient Lasix from 20 mg daily 40 mg days without significant improvement, patient also had a brief episode of fever 7 days ago and started on Augmentin which is to be completed today.  Patient denies any cough, no abdominal pain or diarrhea, no urinary symptoms.  Denies any fever or chills.  Husband at bedside also reported patient has had several episodes of bradycardia with her heart rate dropped to 40s to 50s, despite, it appeared that her blood pressure has been stable. ED Course: Patient was found to be hypoxic, stabilized on 2 L oxygen O2 saturation 98%.  Hemoglobin 10, WBC 9.5, sodium 137, potassium 4.2, creatinine 1.1.  Blood pressure remained on the higher side systolic 440N  to 027O, chest x-ray showed pulmonary congestion with left-sided pleural effusion which is new compared to chest x-ray 1 year ago. Trop 83>93, EKG showed A. fib.  Review of Systems: .    Past Medical History:  Diagnosis Date  . Dementia ([place])   . Hypertension   . Stroke [facility])     Past Surgical History:  Procedure Laterality Date  . ABDOMINAL HYSTERECTOMY       reports that she has never smoked. She has never used smokeless tobacco. She reports that she does not drink alcohol and does not use drugs.  No Known Allergies  Family History  Family history unknown: Yes     Prior to Admission medications   Medication Sig Start Date End Date Taking? Authorizing Provider  acetaminophen (TYLENOL) 500 MG tablet Take 1,000 mg by mouth every 8 (eight) hours as needed for moderate pain.   Yes [provider]  amoxicillin-clavulanate (AUGMENTIN) 875-125 MG tablet Take 1 tablet by mouth 2 (two) times daily.   Yes [provider]  atorvastatin (LIPITOR) 20 MG tablet Take 20 mg by mouth daily. 08/15/19  Yes [provider]  carvedilol (COREG) 12.5 MG tablet Take 12.5 mg by mouth 2 (two) times daily. 06/14/19  Yes [provider]  ferrous sulfate 325 (65 FE) MG tablet Take 325 mg by mouth daily with breakfast.   Yes [provider]  furosemide (LASIX) 20 MG tablet Take 20 mg by mouth daily. 07/18/19  Yes [provider]  hydrALAZINE (APRESOLINE) 100 MG tablet Take 100 mg by mouth 3 (three) times daily.   Yes [provider]  memantine (NAMENDA) 10 MG tablet Take 10 mg by mouth daily.  07/17/19  Yes [provider]  vitamin B-12 (CYANOCOBALAMIN) 100 MCG tablet Take 100 mcg by mouth daily.   Yes [provider]    Physical Exam: Vitals:   08/17/20 1400 08/17/20 1415 08/17/20 1430 08/17/20 1445  BP: (!) 159/114 (!) 172/83  (!) 195/26  Pulse: (!) 39 (!) 38 62 (!) 58  Resp: 16 18 15 15   Temp:      TempSrc:      SpO2:  95% 95% 95% 92%    Constitutional: NAD, calm, comfortable Vitals:   08/17/20 1400 08/17/20 1415 08/17/20 1430 08/17/20 1445  BP: (!) 159/114 (!) 172/83  (!) 195/26  Pulse: (!) 39 (!) 38 62 (!) 58  Resp: 16 18 15 15   Temp:      TempSrc:      SpO2: 95% 95% 95% 92%   Eyes: PERRL, lids and conjunctivae normal ENMT: Mucous membranes are moist. Posterior pharynx clear of any exudate or lesions.Normal dentition.  Neck: normal, supple, no masses, no thyromegaly.  JVD about 6 to 7 cm above clavicles Respiratory: clear to auscultation bilaterally, fine crackles on the bilateral lower lungs, more on the right side, diminished breathing sound on the left base. Increased respiratory effort. No accessory muscle use.  Cardiovascular: Irregular heart rate. 2+ extremity edema. 2+ pedal pulses. No carotid bruits.  Abdomen: no tenderness, no masses palpated. No hepatosplenomegaly. Bowel sounds positive.  Musculoskeletal: no clubbing / cyanosis. No joint deformity upper and lower extremities. Good ROM, no contractures. Normal muscle tone.  Skin: no rashes, lesions, ulcers. No induration Neurologic: Left-sided weakness Psychiatric: Oriented to herself, confused about time and place    Labs on Admission: I have personally reviewed following labs and imaging studies  CBC: Recent Labs  Lab 08/17/20 1236  WBC 9.5  HGB 10.0*  HCT 32.9*  MCV 98.2  PLT 485   Basic Metabolic Panel: Recent Labs  Lab 08/17/20 1236  NA 137  K 4.2  CL 106  CO2 21*  GLUCOSE 139*  BUN 22  CREATININE 1.10*  CALCIUM 7.8*   GFR: CrCl cannot be calculated (Unknown ideal weight.). Liver Function Tests: No results for input(s): AST, ALT, ALKPHOS, BILITOT, PROT, ALBUMIN in the last 168 hours. No results for input(s): LIPASE, AMYLASE in the last 168 hours. No results for input(s): AMMONIA in the last 168 hours. Coagulation Profile: No results for input(s): INR, PROTIME in the last 168 hours. Cardiac Enzymes: No  results for input(s): CKTOTAL, CKMB, CKMBINDEX, TROPONINI in the last 168 hours. BNP (last 3 results) No results for input(s): PROBNP in the last 8760 hours. HbA1C: No results for input(s): HGBA1C in the last 72 hours. CBG: No results for input(s): GLUCAP in the last 168 hours. Lipid Profile: No results for input(s): CHOL, HDL, LDLCALC, TRIG, CHOLHDL, LDLDIRECT in the last 72 hours. Thyroid Function Tests: No results for input(s): TSH, T4TOTAL, FREET4, T3FREE, THYROIDAB in the last 72 hours. Anemia Panel: No results for input(s): VITAMINB12, FOLATE, FERRITIN, TIBC, IRON, RETICCTPCT in the last 72 hours. Urine analysis:    Component Value Date/Time   COLORURINE YELLOW 11/23/2017 1645   APPEARANCEUR CLOUDY (A) 11/23/2017 1645   LABSPEC 1.006 11/23/2017 1645   PHURINE 7.0 11/23/2017 1645   GLUCOSEU NEGATIVE 11/23/2017 1645   HGBUR SMALL (A) 11/23/2017 [place] NEGATIVE 11/23/2017 [place] 11/23/2017 1645   PROTEINUR 30 (A) 11/23/2017 1645   NITRITE NEGATIVE 11/23/2017  [place] (A) 11/23/2017 1645    Radiological Exams on Admission: DG Chest 2 View  Result Date: 08/17/2020 CLINICAL DATA:  Status post fall. EXAM: CHEST - 2 VIEW COMPARISON:  August 18, 2019. FINDINGS: Stable cardiomegaly. No pneumothorax is noted. Mild left pleural effusion is noted with probable associated left basilar atelectasis or infiltrate. Central pulmonary vascular congestion is noted with possible bilateral perihilar edema. Bony thorax is unremarkable. IMPRESSION: Mild left pleural effusion with probable associated left basilar atelectasis or infiltrate. Central pulmonary vascular congestion with possible bilateral perihilar edema. Aortic Atherosclerosis (ICD10-I70.0). Electronically Signed   By: Marijo Conception M.D.   On: 08/17/2020 12:19   DG Pelvis 1-2 Views  Result Date: 08/17/2020 CLINICAL DATA:  Fall. EXAM: PELVIS - 1-2 VIEW COMPARISON:  August 18, 2019.  FINDINGS: There is no evidence of pelvic fracture or diastasis. No pelvic bone lesions are seen. IMPRESSION: Negative. Electronically Signed   By: Marijo Conception M.D.   On: 08/17/2020 12:21   DG FEMUR, MIN 2 VIEWS RIGHT  Result Date: 08/17/2020 CLINICAL DATA:  Fall. EXAM: RIGHT FEMUR 2 VIEWS COMPARISON:  None. FINDINGS: There is no evidence of fracture or other focal bone lesions. Soft tissues are unremarkable. IMPRESSION: Negative. Electronically Signed   By: Marijo Conception M.D.   On: 08/17/2020 12:22    EKG: Independently reviewed. A-fib  Assessment/Plan Active Problems:   CHF (congestive heart failure) ([place])  (please populate well all problems here in Problem List. (For example, if patient is on BP meds at home and you resume or decide to hold them, it is a problem that needs to be her. Same for CAD, COPD, HLD and so on)  Acute on chronic diastolic CHF decompensation -Clinically patient is fluid overloaded, agreed with IV Lasix daily, daily BMP, daily weight -Continue patient's home BP medication hydralazine 100 mg 3 times daily, irbesartan 150 mg daily may further adjust according to BP response. -Echo -D/W pt's cardiology at [facility] [phone], who is out of town, but will be very happy to discuss outpatient treatment plan tomorrow.  Hypertension emergency -As above.  New onset A. Fib? -According to patient husband report, patient had several episodes during which her heart rate dropped to 40s, but it appears that her blood pressure has maintained. -CHADS2=4, will restart Xarelto -Will continue Coreg 12.5 mg twice daily for now, will monitor heart rate.  Acute hypoxic respite failure secondary to CHF decompensation -Diuresis and wean down oxygen  Recent fever -Patient has been on antibiotics for 7 days, will discontinue antibiotics.  History of DVT -Appears that Xarelto was removed recently, but given the new onset of A. fib, will restart patient on  Xarelto.  Advanced dementia -Continue memantine. DVT prophylaxis: Xarelto Code Status: *Full code Family Communication: Husband at bedside Disposition Plan: Expect more than 2 midnight hospital stay, for aggressive diuresis and blood pressure medication adjustment Consults called: Discussed with patient cardiologist Cathlean Sauer [phone]), who is out of town today, but will be glad to discuss this patient tomorrow. Admission status: Telemetry admission   Lequita Halt MD [facility] Pager [phone]  08/17/2020, 2:48 PM

## 2020-08-17 NOTE — ED Notes (Signed)
Daughter would like a call when mother gets admitted and moved upstairs if possible. [phone]

## 2020-08-17 NOTE — ED Notes (Signed)
Failed attempt to collect labs. RN notified

## 2020-08-17 NOTE — ED Triage Notes (Signed)
Pt denies memory of falling or pain, but yells out when being moved or touching of left hip. Deformity noted. 2+ bilateral leg swelling noted. Does not normally wear home o2. 93% on [facility] currently. History of dementia.

## 2020-08-18 ENCOUNTER — Encounter (HOSPITAL_COMMUNITY): Payer: Self-pay | Admitting: Internal Medicine

## 2020-08-18 ENCOUNTER — Inpatient Hospital Stay (HOSPITAL_COMMUNITY): Payer: Medicare Other

## 2020-08-18 ENCOUNTER — Other Ambulatory Visit: Payer: Self-pay

## 2020-08-18 DIAGNOSIS — I5033 Acute on chronic diastolic (congestive) heart failure: Secondary | ICD-10-CM

## 2020-08-18 DIAGNOSIS — I509 Heart failure, unspecified: Secondary | ICD-10-CM | POA: Diagnosis not present

## 2020-08-18 DIAGNOSIS — I351 Nonrheumatic aortic (valve) insufficiency: Secondary | ICD-10-CM

## 2020-08-18 DIAGNOSIS — I495 Sick sinus syndrome: Secondary | ICD-10-CM

## 2020-08-18 DIAGNOSIS — I5031 Acute diastolic (congestive) heart failure: Secondary | ICD-10-CM | POA: Diagnosis not present

## 2020-08-18 DIAGNOSIS — J9601 Acute respiratory failure with hypoxia: Secondary | ICD-10-CM | POA: Diagnosis not present

## 2020-08-18 DIAGNOSIS — I10 Essential (primary) hypertension: Secondary | ICD-10-CM | POA: Diagnosis not present

## 2020-08-18 LAB — BASIC METABOLIC PANEL
Anion gap: 11 (ref 5–15)
BUN: 24 mg/dL — ABNORMAL HIGH (ref 8–23)
CO2: 20 mmol/L — ABNORMAL LOW (ref 22–32)
Calcium: 7.5 mg/dL — ABNORMAL LOW (ref 8.9–10.3)
Chloride: 105 mmol/L (ref 98–111)
Creatinine, Ser: 1.16 mg/dL — ABNORMAL HIGH (ref 0.44–1.00)
GFR, Estimated: 43 mL/min — ABNORMAL LOW (ref >60)
Glucose, Bld: 102 mg/dL — ABNORMAL HIGH (ref 70–99)
Potassium: 3.6 mmol/L (ref 3.5–5.1)
Sodium: 136 mmol/L (ref 135–145)

## 2020-08-18 LAB — URINALYSIS, ROUTINE W REFLEX MICROSCOPIC
Bilirubin Urine: NEGATIVE
Glucose, UA: NEGATIVE mg/dL
Hgb urine dipstick: NEGATIVE
Ketones, ur: NEGATIVE mg/dL
Leukocytes,Ua: NEGATIVE
Nitrite: NEGATIVE
Protein, ur: 30 mg/dL — AB
Specific Gravity, Urine: 1.009 (ref 1.005–1.030)
pH: 5 (ref 5.0–8.0)

## 2020-08-18 LAB — TROPONIN I (HIGH SENSITIVITY): Troponin I (High Sensitivity): 84 ng/L — ABNORMAL HIGH (ref <18)

## 2020-08-18 LAB — TSH: TSH: 2.979 u[IU]/mL (ref 0.350–4.500)

## 2020-08-18 MED ORDER — AMLODIPINE BESYLATE 5 MG PO TABS: 2.5000 mg | ORAL_TABLET | Freq: Every day | ORAL | Status: DC

## 2020-08-18 MED ORDER — COLLAGENASE 250 UNIT/GM EX OINT
TOPICAL_OINTMENT | Freq: Every day | CUTANEOUS | Status: DC
  Filled 2020-08-18: qty 30

## 2020-08-18 NOTE — Progress Notes (Signed)
PROGRESS NOTE        PATIENT DETAILS Name: Tracy Valdez Age: 86 y.o. Sex: female Date of Birth: [birth year] Admit Date: 08/17/2020 Admitting Physician Lequita Halt, MD FOY:[email or web address], Delsa Grana, MD  Brief Narrative: Patient is a 86 y.o. 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  See below for further details.  Subjective: Sleeping when seen earlier this morning-continues to have significant lower extremity edema.  Not in any distress.  Assessment/Plan: Acute hypoxic respiratory failure secondary to decompensated heart failure: Remains significantly volume overloaded-continue IV Lasix-follow weights/electrolytes.  Continued attempts to titrate down FiO2  HTN: Blood pressure remains labile-diastolic readings are on the lower side-likely secondary to aortic regurgitation) continue Avapro, hydralazine-follow and optimize accordingly.  Bradycardia: Appears to be mostly nocturnal-TSH within normal limits-continue telemetry monitoring-cardiology consulted.  PAF: Rate controlled-fortunately not requiring any rate control agents-on anticoagulation with Xarelto  Severe aortic insufficiency: Await further recommendations from cardiology-but suspect needs outpatient follow-up.  CKD stage II: Creatinine close to baseline-follow periodically.  History of VTE: Had come off Xarelto-but due to atrial fibrillation has been restarted on anticoagulation this admission.  Dementia: Continue Namenda  Debility/deconditioning: Appreciate PT/OT eval-recommendations are for SNF.  Consults: Cardiology  DVT Prophylaxis : Rivaroxaban (XARELTO)  tablet 15 mg   Diet: Diet Order            Diet Heart Room service appropriate? Yes; Fluid consistency: Thin; Fluid restriction: 1800 mL Fluid  Diet effective now                  Code Status: Full code   Family Communication: Spoke with son-Jason ([phone]) over phone-10/19  Disposition Plan: SNF when ready for discharge Status is: Inpatient  Remains inpatient appropriate because:Inpatient level of care appropriate due to severity of illness   Dispo: The patient is from: SNF              Anticipated d/c is to: SNF              Anticipated d/c date is: > 3 days              Patient currently is not medically stable to d/c.   Barriers to Discharge: Decompensated heart failure requiring IV diuretics  Antimicrobial agents: Anti-infectives (From admission, onward)   None       Time spent: 35 minutes-Greater than 50% of this time was spent in counseling, explanation of diagnosis, planning of further management, and coordination of care.  MEDICATIONS: Scheduled Meds: . atorvastatin  20 mg Oral Daily  . ferrous sulfate  325 mg Oral Q breakfast  . furosemide  40 mg Intravenous Daily  . hydrALAZINE  100 mg Oral TID  . irbesartan  150 mg Oral Daily  . memantine  10 mg Oral Daily  . Rivaroxaban  15 mg Oral Q supper  . sodium chloride flush  3 mL Intravenous Q12H  . vitamin B-12  100 mcg Oral Daily   Continuous Infusions: . sodium chloride     PRN Meds:.sodium chloride, acetaminophen, hydrALAZINE, ondansetron (ZOFRAN) IV, sodium chloride flush   PHYSICAL EXAM: Vital signs: Vitals:   08/18/20  0800 08/18/20 0815 08/18/20 0945 08/18/20 1000  BP: (!) 144/41   (!) 173/30  Pulse: (!) 37 61 71 61  Resp: 13 12 15 14   Temp:      TempSrc:      SpO2: 99% 99% 99% 99%   There were no vitals filed for this visit. There is no height or weight on file to calculate BMI.   Gen Exam:Alert awake-not in any distress HEENT:atraumatic, normocephalic Chest: B/L clear to  auscultation anteriorly CVS:S1S2 regular Abdomen:soft non tender, non distended Extremities:++ edema Skin: no rash  I have personally reviewed following labs and imaging studies  LABORATORY DATA: CBC: Recent Labs  Lab 08/17/20 1236  WBC 9.5  HGB 10.0*  HCT 32.9*  MCV 98.2  PLT 950    Basic Metabolic Panel: Recent Labs  Lab 08/17/20 1236 08/18/20 0212  NA 137 136  K 4.2 3.6  CL 106 105  CO2 21* 20*  GLUCOSE 139* 102*  BUN 22 24*  CREATININE 1.10* 1.16*  CALCIUM 7.8* 7.5*    GFR: CrCl cannot be calculated (Unknown ideal weight.).  Liver Function Tests: No results for input(s): AST, ALT, ALKPHOS, BILITOT, PROT, ALBUMIN in the last 168 hours. No results for input(s): LIPASE, AMYLASE in the last 168 hours. No results for input(s): AMMONIA in the last 168 hours.  Coagulation Profile: No results for input(s): INR, PROTIME in the last 168 hours.  Cardiac Enzymes: No results for input(s): CKTOTAL, CKMB, CKMBINDEX, TROPONINI in the last 168 hours.  BNP (last 3 results) No results for input(s): PROBNP in the last 8760 hours.  Lipid Profile: No results for input(s): CHOL, HDL, LDLCALC, TRIG, CHOLHDL, LDLDIRECT in the last 72 hours.  Thyroid Function Tests: Recent Labs    08/18/20 0534  TSH 2.979    Anemia Panel: No results for input(s): VITAMINB12, FOLATE, FERRITIN, TIBC, IRON, RETICCTPCT in the last 72 hours.  Urine analysis:    Component Value Date/Time   COLORURINE YELLOW 11/23/2017 1645   APPEARANCEUR CLOUDY (A) 11/23/2017 1645   LABSPEC 1.006 11/23/2017 1645   PHURINE 7.0 11/23/2017 1645   GLUCOSEU NEGATIVE 11/23/2017 1645   HGBUR SMALL (A) 11/23/2017 [place] NEGATIVE 11/23/2017 [place] 11/23/2017 1645   PROTEINUR 30 (A) 11/23/2017 1645   NITRITE NEGATIVE 11/23/2017 1645   LEUKOCYTESUR LARGE (A) 11/23/2017 1645    Sepsis Labs: Lactic Acid, Venous No results found for: LATICACIDVEN  MICROBIOLOGY: Recent Results  (from the past 240 hour(s))  Respiratory Panel by RT PCR (Flu A&B, Covid) - Nasopharyngeal Swab     Status: None   Collection Time: 08/17/20 12:37 PM   Specimen: Nasopharyngeal Swab  Result Value Ref Range Status   SARS Coronavirus 2 by RT PCR NEGATIVE NEGATIVE Final    Comment: (NOTE) SARS-CoV-2 target nucleic acids are NOT DETECTED.  The SARS-CoV-2 RNA is generally detectable in upper respiratoy specimens during the acute phase of infection. The lowest concentration of SARS-CoV-2 viral copies this assay can detect is 131 copies/mL. A negative result does not preclude SARS-Cov-2 infection and should not be used as the sole basis for treatment or other patient management decisions. A negative result may occur with  improper specimen collection/handling, submission of specimen other than nasopharyngeal swab, presence of viral mutation(s) within the areas targeted by this assay, and inadequate number of viral copies (<131 copies/mL). A negative result must be combined with clinical observations, patient history, and epidemiological information. The expected result is Negative.  Fact Sheet for  Patients:  PinkCheek.be  Fact Sheet for Healthcare Providers:  GravelBags.it  This test is no t yet approved or cleared by the [place] FDA and  has been authorized for detection and/or diagnosis of SARS-CoV-2 by FDA under an Emergency Use Authorization (EUA). This EUA will remain  in effect (meaning this test can be used) for the duration of the COVID-19 declaration under Section 564(b)(1) of the Act, 21 U.S.C. section 360bbb-3(b)(1), unless the authorization is terminated or revoked sooner.     Influenza A by PCR NEGATIVE NEGATIVE Final   Influenza B by PCR NEGATIVE NEGATIVE Final    Comment: (NOTE) The Xpert Xpress SARS-CoV-2/FLU/RSV assay is intended as an aid in  the diagnosis of influenza from Nasopharyngeal swab specimens  and  should not be used as a sole basis for treatment. Nasal washings and  aspirates are unacceptable for Xpert Xpress SARS-CoV-2/FLU/RSV  testing.  Fact Sheet for Patients: PinkCheek.be  Fact Sheet for Healthcare Providers: GravelBags.it  This test is not yet approved or cleared by the [place] FDA and  has been authorized for detection and/or diagnosis of SARS-CoV-2 by  FDA under an Emergency Use Authorization (EUA). This EUA will remain  in effect (meaning this test can be used) for the duration of the  Covid-19 declaration under Section 564(b)(1) of the Act, 21  U.S.C. section 360bbb-3(b)(1), unless the authorization is  terminated or revoked. Performed at [facility], [place] [address] [place], [place] [address]     RADIOLOGY STUDIES/RESULTS: DG Chest 2 View  Result Date: 08/17/2020 CLINICAL DATA:  Status post fall. EXAM: CHEST - 2 VIEW COMPARISON:  August 18, 2019. FINDINGS: Stable cardiomegaly. No pneumothorax is noted. Mild left pleural effusion is noted with probable associated left basilar atelectasis or infiltrate. Central pulmonary vascular congestion is noted with possible bilateral perihilar edema. Bony thorax is unremarkable. IMPRESSION: Mild left pleural effusion with probable associated left basilar atelectasis or infiltrate. Central pulmonary vascular congestion with possible bilateral perihilar edema. Aortic Atherosclerosis (ICD10-I70.0). Electronically Signed   By: Marijo Conception M.D.   On: 08/17/2020 12:19   DG Pelvis 1-2 Views  Result Date: 08/17/2020 CLINICAL DATA:  Fall. EXAM: PELVIS - 1-2 VIEW COMPARISON:  August 18, 2019. FINDINGS: There is no evidence of pelvic fracture or diastasis. No pelvic bone lesions are seen. IMPRESSION: Negative. Electronically Signed   By: Marijo Conception M.D.   On: 08/17/2020 12:21   ECHOCARDIOGRAM COMPLETE  Result Date: 08/17/2020    ECHOCARDIOGRAM REPORT    Patient Name:   Tracy Valdez Date of Exam: 08/17/2020 Medical Rec #:  [id number]     Height:       61.0 in Accession #:    [id number]    Weight:       107.0 lb Date of Birth:  1934/07/12      BSA:          1.448 m Patient Age:    28 years      BP:           195/26 mmHg Patient Gender: F             HR:           79 bpm. Exam Location:  Inpatient Procedure: 2D Echo Indications:    acute diastolic CHF 798.92  History:        Patient has no prior history of Echocardiogram examinations.  Sonographer:    Johny Chess Referring Phys: [id number] [place]  IMPRESSIONS  1. Akinesis of the basal inferolateral wall with overall low normal LV function; mild LVH; mild LVE; calcified aortic valve with moderate to severe AI; severe LAE; small to moderate pericardial effusion.  2. Left ventricular ejection fraction, by estimation, is 50 to 55%. The left ventricle has low normal function. The left ventricle demonstrates regional wall motion abnormalities (see scoring diagram/findings for description). The left ventricular internal cavity size was mildly dilated. There is mild left ventricular hypertrophy. Left ventricular diastolic parameters are indeterminate.  3. Right ventricular systolic function is normal. The right ventricular size is normal. There is moderately elevated pulmonary artery systolic pressure.  4. Left atrial size was severely dilated.  5. Moderate pericardial effusion. Moderate pleural effusion in the left lateral region.  6. The mitral valve is normal in structure. Trivial mitral valve regurgitation. No evidence of mitral stenosis. Moderate mitral annular calcification.  7. The aortic valve is tricuspid. Aortic valve regurgitation is moderate to severe. Mild to moderate aortic valve sclerosis/calcification is present, without any evidence of aortic stenosis.  8. The inferior vena cava is normal in size with greater than 50% respiratory variability, suggesting right atrial pressure of 3 mmHg. FINDINGS  Left  Ventricle: Left ventricular ejection fraction, by estimation, is 50 to 55%. The left ventricle has low normal function. The left ventricle demonstrates regional wall motion abnormalities. The left ventricular internal cavity size was mildly dilated. There is mild left ventricular hypertrophy. Left ventricular diastolic parameters are indeterminate. Right Ventricle: The right ventricular size is normal.Right ventricular systolic function is normal. There is moderately elevated pulmonary artery systolic pressure. The tricuspid regurgitant velocity is 3.38 m/s, and with an assumed right atrial pressure of 3 mmHg, the estimated right ventricular systolic pressure is 97.3 mmHg. Left Atrium: Left atrial size was severely dilated. Right Atrium: Right atrial size was normal in size. Pericardium: A moderately sized pericardial effusion is present. Mitral Valve: The mitral valve is normal in structure. Moderate mitral annular calcification. Trivial mitral valve regurgitation. No evidence of mitral valve stenosis. Tricuspid Valve: The tricuspid valve is normal in structure. Tricuspid valve regurgitation is mild . No evidence of tricuspid stenosis. Aortic Valve: The aortic valve is tricuspid. Aortic valve regurgitation is moderate to severe. Mild to moderate aortic valve sclerosis/calcification is present, without any evidence of aortic stenosis. Pulmonic Valve: The pulmonic valve was not well visualized. Pulmonic valve regurgitation is mild. No evidence of pulmonic stenosis. Aorta: The aortic root is normal in size and structure. Venous: The inferior vena cava is normal in size with greater than 50% respiratory variability, suggesting right atrial pressure of 3 mmHg. IAS/Shunts: No atrial level shunt detected by color flow Doppler. Additional Comments: Akinesis of the basal inferolateral wall with overall low normal LV function; mild LVH; mild LVE; calcified aortic valve with moderate to severe AI; severe LAE; small to  moderate pericardial effusion. There is a moderate pleural effusion in the left lateral region.  LEFT VENTRICLE PLAX 2D LVIDd:         5.40 cm LVIDs:         4.30 cm LV PW:         0.90 cm LV IVS:        1.10 cm LVOT diam:     1.90 cm LVOT Area:     2.84 cm  RIGHT VENTRICLE             IVC RV S prime:     13.40 cm/s  IVC diam: 1.60 cm LEFT  ATRIUM             Index       RIGHT ATRIUM           Index LA diam:        4.30 cm 2.97 cm/m  RA Area:     13.20 cm LA Vol (A2C):   73.4 ml 50.70 ml/m RA Volume:   29.30 ml  20.24 ml/m LA Vol (A4C):   74.1 ml 51.18 ml/m LA Biplane Vol: 74.1 ml 51.18 ml/m   AORTA Ao Root diam: 3.30 cm Ao Asc diam:  3.30 cm TRICUSPID VALVE TR Peak grad:   45.7 mmHg TR Vmax:        338.00 cm/s  SHUNTS Systemic Diam: 1.90 cm Kirk Ruths MD Electronically signed by Kirk Ruths MD Signature Date/Time: 08/17/2020/5:14:12 PM    Final    DG FEMUR, MIN 2 VIEWS RIGHT  Result Date: 08/17/2020 CLINICAL DATA:  Fall. EXAM: RIGHT FEMUR 2 VIEWS COMPARISON:  None. FINDINGS: There is no evidence of fracture or other focal bone lesions. Soft tissues are unremarkable. IMPRESSION: Negative. Electronically Signed   By: Marijo Conception M.D.   On: 08/17/2020 12:22     LOS: 1 day   Oren Binet, MD  [facility]    To contact the attending provider between 7A-7P or the covering provider during after hours 7P-7A, please log into the web site [email or web address] and access using universal [facility] password for that web site. If you do not have the password, please call the hospital operator.  08/18/2020, 10:02 AM

## 2020-08-18 NOTE — ED Notes (Signed)
Spoke to Dr. Marlowe Sax after patient became bradycardic again. She stated if systolic BP less than 90 to page for further orders.

## 2020-08-18 NOTE — Consult Note (Addendum)
Cardiology Consultation:   Patient ID: [name] MRN: [id number]; DOB: 16-Oct-1934  Admit date: 08/17/2020 Date of Consult: 08/18/2020  Primary Care Provider: Curly Rim, MD [facility] Cardiologist: Tracy Valdez - Dr. Lurene Shadow NP [place] Electrophysiologist:  None    Patient Profile:   Tracy Valdez (pronounced "suss-koh") is a 86 y.o. female with a hx of aortic insufficiency, HTN, CVA, DVT, pericardial effusion, dementia, CKD stage IIIa (prior Cr 1.2) who is being seen today for the evaluation of CHF/aortic insufficiency/bradycardia at the request of Tracy Valdez.  History of Present Illness:   History is obtained from the chart and from patient's daughter/husband at bedside as patient has dementia and is not a reliable historian. She is followed by Dr. Lurene Shadow NP with Tracy Valdez for history of aortic insufficiency with last echo 03/16/20 showing EF 60%, mildly dilated LV, basal inferior segment appears hypokinetic otherwise wall motion is normal, mild-moderate AI, small-moderate pericardial effusion, no definite evidence of tamponade but early tamponade could not be excluded. No clinical concerns were noted in follow-up. She has had issues with labile blood pressures. I do not see a prior history of heart failure outlined. Her daughter does say she has had periodic edema in the past but never anything to this degree. Her family has had to move her to a memory care unit due to progessive memory decline and she spends most of her time in a wheelchair. She participates in the busywork activities the facility provides and takes a nap most days. Daughter states the patient was diagnosed with a DVT in 04/2020 and was on anticoagulation for a period of time but is no longer on it and she does not specifically know why. Neurology note from 05/2020 encouraged the family to discuss fall risk and ongoing anticoagulation with her living facility, so perhaps this is what  happened.  3-4 weeks ago, Tracy Valdez began developing increasing swelling in her legs. Her living facility increased Lasix from 20mg ->40mg  daily but no improvement. She also had a fever 7 days without any other focal symptoms and was started on Augmentin with resolution. Due to persistent edema, 5lb weight gain and hypoxia of 86% on RA at her living facility, she was brought to the hospital. SOB was noted in H/P but daughter and husband do not think this has been present recently. In general they say she never complains. They deny any recent falls.  Her workup so far has demonstrated flat troponin elevation 83->93->84, elevated BNP of 3923, anemia with Hgb 10.0, normal TSH, normal resp panel, and renal function relatively at baseline. CXR showed left pleural effusion with associated left basilar atelectasis or infiltrate, central pulmonary vascular congestion with possible bilateral perihilar edema. She was started on 40mg  IV Lasix daily with improving lower extremity edema. She was also felt to have possible atrial fibrillation so was started back on Xarelto 15mg  daily. She was administered home carvedilol but became bradycardic into the 30s so this was discontinued. See below for discussion of rhythm as I am not convinced this was atrial fibrillation. Blood pressure remains elevated albeit with marked widened pulse pressure so irbesartan was started. 2D echo showed EF 50-55% with akinesis of the basal inferolateral wall, mild LVH, mild LVE, moderate-severe AI, severe LAE, small to moderate pericardial effusion as well as moderate left lateral pleural effusion.   Past Medical History:  Diagnosis Date  . Aortic insufficiency   . Chronic kidney disease, stage 3a ([place])   . Dementia ([place])   .  DVT (deep venous thrombosis) ([place])   . Hypertension   . Pericardial effusion   . Stroke Tracy Valdez)     Past Surgical History:  Procedure Laterality Date  . ABDOMINAL HYSTERECTOMY       Home Medications:  Prior to  Admission medications   Medication Sig Start Date End Date Taking? Authorizing Provider  acetaminophen (TYLENOL) 500 MG tablet Take 1,000 mg by mouth every 8 (eight) hours as needed for moderate pain.   Yes [provider]  amoxicillin-clavulanate (AUGMENTIN) 875-125 MG tablet Take 1 tablet by mouth 2 (two) times daily.   Yes [provider]  atorvastatin (LIPITOR) 20 MG tablet Take 20 mg by mouth daily. 08/15/19  Yes [provider]  carvedilol (COREG) 12.5 MG tablet Take 12.5 mg by mouth 2 (two) times daily. 06/14/19  Yes [provider]  ferrous sulfate 325 (65 FE) MG tablet Take 325 mg by mouth daily with breakfast.   Yes [provider]  furosemide (LASIX) 20 MG tablet Take 20 mg by mouth daily. 07/18/19  Yes [provider]  hydrALAZINE (APRESOLINE) 100 MG tablet Take 100 mg by mouth 3 (three) times daily.   Yes [provider]  memantine (NAMENDA) 10 MG tablet Take 10 mg by mouth daily.  07/17/19  Yes [provider]  vitamin B-12 (CYANOCOBALAMIN) 100 MCG tablet Take 100 mcg by mouth daily.   Yes [provider]    Inpatient Medications: Scheduled Meds: . atorvastatin  20 mg Oral Daily  . collagenase   Topical Daily  . ferrous sulfate  325 mg Oral Q breakfast  . furosemide  40 mg Intravenous Daily  . hydrALAZINE  100 mg Oral TID  . irbesartan  150 mg Oral Daily  . memantine  10 mg Oral Daily  . Rivaroxaban  15 mg Oral Q supper  . sodium chloride flush  3 mL Intravenous Q12H  . vitamin B-12  100 mcg Oral Daily   Continuous Infusions: . sodium chloride     PRN Meds: sodium chloride, acetaminophen, hydrALAZINE, ondansetron (ZOFRAN) IV, sodium chloride flush  Allergies:   No Known Allergies  Social History:   Social History   Socioeconomic History  . Marital status: Married    Spouse name: Not on file  . Number of children: Not on file  . Years of education: Not on file  . Highest education  level: Not on file  Occupational History  . Not on file  Tobacco Use  . Smoking status: Never Smoker  . Smokeless tobacco: Never Used  Vaping Use  . Vaping Use: Never used  Substance and Sexual Activity  . Alcohol use: Never  . Drug use: Never  . Sexual activity: Not on file  Other Topics Concern  . Not on file  Social History Narrative  . Not on file   Social Determinants of Valdez   Financial Resource Strain:   . Difficulty of Paying Living Expenses: Not on file  Food Insecurity:   . Worried About Charity fundraiser in the Last Year: Not on file  . Ran Out of Food in the Last Year: Not on file  Transportation Needs:   . Lack of Transportation (Medical): Not on file  . Lack of Transportation (Non-Medical): Not on file  Physical Activity:   . Days of Exercise per Week: Not on file  . Minutes of Exercise per Session: Not on file  Stress:   . Feeling of Stress : Not on file  Social  Connections:   . Frequency of Communication with Friends and Family: Not on file  . Frequency of Social Gatherings with Friends and Family: Not on file  . Attends Religious Services: Not on file  . Active Member of Clubs or Organizations: Not on file  . Attends Archivist Meetings: Not on file  . Marital Status: Not on file  Intimate Partner Violence:   . Fear of Current or Ex-Partner: Not on file  . Emotionally Abused: Not on file  . Physically Abused: Not on file  . Sexually Abused: Not on file    Family History:   Family History  Family history unknown: Yes  Patient's dementia precludes accurate assessment  ROS:  Patient's dementia precludes accurate assessment, otherwise outlined above provided by family     Physical Exam/Data:   Vitals:   08/18/20 0815 08/18/20 0945 08/18/20 1000 08/18/20 1033  BP:   (!) 173/30 (!) 183/55  Pulse: 61 71 61 62  Resp: 12 15 14 16   Temp:    97.6 F (36.4 C)  TempSrc:    Oral  SpO2: 99% 99% 99% 99%    Intake/Output Summary (Last 24  hours) at 08/18/2020 1516 Last data filed at 08/18/2020 1513 Gross per 24 hour  Intake 180 ml  Output --  Net 180 ml   Last 3 Weights 03/21/2020 01/02/2020 08/18/2019  Weight (lbs) 107 lb 107 lb 155 lb  Weight (kg) 48.535 kg 48.535 kg 70.308 kg     There is no height or weight on file to calculate BMI.  General: Thin elderly WF in no acute distress. Head: Normocephalic, atraumatic, sclera non-icteric, no xanthomas, nares are without discharge. Neck: Negative for carotid bruits. JVP not elevated. Lungs: Decreased BS at left base otherwise no wheezes, rales, or rhonchi. Breathing is unlabored but fair inspiratory effort, patient requires assistance to sit up and lean over to hear Heart: RRR, soft diastolic murmur RUSB, no rubs or gallops, heart sounds are not distant Abdomen: Soft, non-tender, non-distended with normoactive bowel sounds. No rebound/guarding. Extremities: No clubbing or cyanosis. 2+ soft pale puffy bilateral LE edema. Distal pedal pulses are 2+ and equal bilaterally. Neuro: Was sleeping initially but easily arousable by daughter. Alert and oriented to self, thinks she is in a hotel, knows daughter, thought husabdn was son. Moves all extremities spontaneously. Psych: Flat affect  EKG:  The EKG was personally reviewed and demonstrates:  08/17/20 heavy baseline artifact, irregular rhythm with some regularity (?atrial bigeminy), rate appears controlled nonspecific diffuse TW changes - f/u EKG appears similar but artifact precludes definitive assessment  08/18/20 appears to be sinus bradycardia 36bpm with one PVC, ? blocked PACs although challenging to discern (only seen most notably on 3rd ventricular beat)  Telemetry:  Telemetry was personally reviewed and demonstrates: largely irregular rhythm with difficulty discerning whether true atrial fib given baseline low p wave amplitude when in NSR, occasional ectopy and artifact - currently most clearly NSR  Relevant CV Studies: 2D  echo 08/17/20  1. Akinesis of the basal inferolateral wall with overall low normal LV  function; mild LVH; mild LVE; calcified aortic valve with moderate to  severe AI; severe LAE; small to moderate pericardial effusion.   2. Left ventricular ejection fraction, by estimation, is 50 to 55%. The  left ventricle has low normal function. The left ventricle demonstrates  regional wall motion abnormalities (see scoring diagram/findings for  description). The left ventricular  internal cavity size was mildly dilated. There is mild left ventricular  hypertrophy. Left ventricular diastolic parameters are indeterminate.   3. Right ventricular systolic function is normal. The right ventricular  size is normal. There is moderately elevated pulmonary artery systolic  pressure.   4. Left atrial size was severely dilated.   5. Moderate pericardial effusion. Moderate pleural effusion in the left  lateral region.   6. The mitral valve is normal in structure. Trivial mitral valve  regurgitation. No evidence of mitral stenosis. Moderate mitral annular  calcification.   7. The aortic valve is tricuspid. Aortic valve regurgitation is moderate  to severe. Mild to moderate aortic valve sclerosis/calcification is  present, without any evidence of aortic stenosis.   8. The inferior vena cava is normal in size with greater than 50%  respiratory variability, suggesting right atrial pressure of 3 mmHg.    Laboratory Data:  High Sensitivity Troponin:   Recent Labs  Lab 08/17/20 1236 08/17/20 1257 08/18/20 0212  TROPONINIHS 83* 93* 84*     Chemistry Recent Labs  Lab 08/17/20 1236 08/18/20 0212  NA 137 136  K 4.2 3.6  CL 106 105  CO2 21* 20*  GLUCOSE 139* 102*  BUN 22 24*  CREATININE 1.10* 1.16*  CALCIUM 7.8* 7.5*  GFRNONAA 46* 43*  ANIONGAP 10 11    No results for input(s): PROT, ALBUMIN, AST, ALT, ALKPHOS, BILITOT in the last 168 hours. Hematology Recent Labs  Lab 08/17/20 1236  WBC 9.5    RBC 3.35*  HGB 10.0*  HCT 32.9*  MCV 98.2  MCH 29.9  MCHC 30.4  RDW 13.4  PLT 282   BNP Recent Labs  Lab 08/17/20 1236  BNP 3,923.3*    DDimer No results for input(s): DDIMER in the last 168 hours.   Radiology/Studies:  DG Chest 2 View  Result Date: 08/17/2020 CLINICAL DATA:  Status post fall. EXAM: CHEST - 2 VIEW COMPARISON:  August 18, 2019. FINDINGS: Stable cardiomegaly. No pneumothorax is noted. Mild left pleural effusion is noted with probable associated left basilar atelectasis or infiltrate. Central pulmonary vascular congestion is noted with possible bilateral perihilar edema. Bony thorax is unremarkable. IMPRESSION: Mild left pleural effusion with probable associated left basilar atelectasis or infiltrate. Central pulmonary vascular congestion with possible bilateral perihilar edema. Aortic Atherosclerosis (ICD10-I70.0). Electronically Signed   By: Marijo Conception M.D.   On: 08/17/2020 12:19   DG Pelvis 1-2 Views  Result Date: 08/17/2020 CLINICAL DATA:  Fall. EXAM: PELVIS - 1-2 VIEW COMPARISON:  August 18, 2019. FINDINGS: There is no evidence of pelvic fracture or diastasis. No pelvic bone lesions are seen. IMPRESSION: Negative. Electronically Signed   By: Marijo Conception M.D.   On: 08/17/2020 12:21   DG Chest Port 1 View  Result Date: 08/18/2020 CLINICAL DATA:  Congestive failure EXAM: PORTABLE CHEST 1 VIEW COMPARISON:  08/17/2020 FINDINGS: Cardiac Valdez is enlarged in size. Aortic calcifications are noted. Increasing vascular congestion is noted with mild interstitial edema. Bilateral pleural effusions are noted left greater than right. No bony abnormality is seen. IMPRESSION: CHF with bilateral effusions left greater than right. The overall appearance is stable from the prior exam. Electronically Signed   By: Inez Catalina M.D.   On: 08/18/2020 15:12   ECHOCARDIOGRAM COMPLETE  Result Date: 08/17/2020    ECHOCARDIOGRAM REPORT   Patient Name:   Tracy Valdez Date of  Exam: 08/17/2020 Medical Rec #:  [id number]     Height:       61.0 in Accession #:    [id number]  Weight:       107.0 lb Date of Birth:  [birth year]      BSA:          1.448 m Patient Age:    60 years      BP:           195/26 mmHg Patient Gender: F             HR:           79 bpm. Exam Location:  Inpatient Procedure: 2D Echo Indications:    acute diastolic CHF 277.41  History:        Patient has no prior history of Echocardiogram examinations.  Sonographer:    Johny Chess Referring Phys: [id number] [place]  1. Akinesis of the basal inferolateral wall with overall low normal LV function; mild LVH; mild LVE; calcified aortic valve with moderate to severe AI; severe LAE; small to moderate pericardial effusion.  2. Left ventricular ejection fraction, by estimation, is 50 to 55%. The left ventricle has low normal function. The left ventricle demonstrates regional wall motion abnormalities (see scoring diagram/findings for description). The left ventricular internal cavity size was mildly dilated. There is mild left ventricular hypertrophy. Left ventricular diastolic parameters are indeterminate.  3. Right ventricular systolic function is normal. The right ventricular size is normal. There is moderately elevated pulmonary artery systolic pressure.  4. Left atrial size was severely dilated.  5. Moderate pericardial effusion. Moderate pleural effusion in the left lateral region.  6. The mitral valve is normal in structure. Trivial mitral valve regurgitation. No evidence of mitral stenosis. Moderate mitral annular calcification.  7. The aortic valve is tricuspid. Aortic valve regurgitation is moderate to severe. Mild to moderate aortic valve sclerosis/calcification is present, without any evidence of aortic stenosis.  8. The inferior vena cava is normal in size with greater than 50% respiratory variability, suggesting right atrial pressure of 3 mmHg. FINDINGS  Left Ventricle: Left ventricular ejection  fraction, by estimation, is 50 to 55%. The left ventricle has low normal function. The left ventricle demonstrates regional wall motion abnormalities. The left ventricular internal cavity size was mildly dilated. There is mild left ventricular hypertrophy. Left ventricular diastolic parameters are indeterminate. Right Ventricle: The right ventricular size is normal.Right ventricular systolic function is normal. There is moderately elevated pulmonary artery systolic pressure. The tricuspid regurgitant velocity is 3.38 m/s, and with an assumed right atrial pressure of 3 mmHg, the estimated right ventricular systolic pressure is 72.0 mmHg. Left Atrium: Left atrial size was severely dilated. Right Atrium: Right atrial size was normal in size. Pericardium: A moderately sized pericardial effusion is present. Mitral Valve: The mitral valve is normal in structure. Moderate mitral annular calcification. Trivial mitral valve regurgitation. No evidence of mitral valve stenosis. Tricuspid Valve: The tricuspid valve is normal in structure. Tricuspid valve regurgitation is mild . No evidence of tricuspid stenosis. Aortic Valve: The aortic valve is tricuspid. Aortic valve regurgitation is moderate to severe. Mild to moderate aortic valve sclerosis/calcification is present, without any evidence of aortic stenosis. Pulmonic Valve: The pulmonic valve was not well visualized. Pulmonic valve regurgitation is mild. No evidence of pulmonic stenosis. Aorta: The aortic root is normal in size and structure. Venous: The inferior vena cava is normal in size with greater than 50% respiratory variability, suggesting right atrial pressure of 3 mmHg. IAS/Shunts: No atrial level shunt detected by color flow Doppler. Additional Comments: Akinesis of the basal inferolateral wall with overall low normal LV  function; mild LVH; mild LVE; calcified aortic valve with moderate to severe AI; severe LAE; small to moderate pericardial effusion. There is a  moderate pleural effusion in the left lateral region.  LEFT VENTRICLE PLAX 2D LVIDd:         5.40 cm LVIDs:         4.30 cm LV PW:         0.90 cm LV IVS:        1.10 cm LVOT diam:     1.90 cm LVOT Area:     2.84 cm  RIGHT VENTRICLE             IVC RV S prime:     13.40 cm/s  IVC diam: 1.60 cm LEFT ATRIUM             Index       RIGHT ATRIUM           Index LA diam:        4.30 cm 2.97 cm/m  RA Area:     13.20 cm LA Vol (A2C):   73.4 ml 50.70 ml/m RA Volume:   29.30 ml  20.24 ml/m LA Vol (A4C):   74.1 ml 51.18 ml/m LA Biplane Vol: 74.1 ml 51.18 ml/m   AORTA Ao Root diam: 3.30 cm Ao Asc diam:  3.30 cm TRICUSPID VALVE TR Peak grad:   45.7 mmHg TR Vmax:        338.00 cm/s  SHUNTS Systemic Diam: 1.90 cm Kirk Ruths MD Electronically signed by Kirk Ruths MD Signature Date/Time: 08/17/2020/5:14:12 PM    Final    DG FEMUR, MIN 2 VIEWS RIGHT  Result Date: 08/17/2020 CLINICAL DATA:  Fall. EXAM: RIGHT FEMUR 2 VIEWS COMPARISON:  None. FINDINGS: There is no evidence of fracture or other focal bone lesions. Soft tissues are unremarkable. IMPRESSION: Negative. Electronically Signed   By: Marijo Conception M.D.   On: 08/17/2020 12:22     Assessment and Plan:   1. Acute on suspected chronic diastolic CHF (associated hypoxia) - no clinical hx of CHF noted in previous cardiology notes but was on Lasix at baseline - continue IV Lasix 40mg  daily as she seems to be improving clinically - may benefit from compression hose as well - given appearance of edema, would also f/u hepatic function panel to assess albumin level  2. Poorly controlled HTN - previous issue as outpatient - continue current amlodipine, hydralazine, and follow with addition of ARB - caution with lowering diastolic pressure too low - BB on hold due to bradycardia - TSH wnl - check UA for proteinuria  3. History of DVT - per last cardiology note 04/2020 was on Xarelto 15mg  BID but no longer on this for unclear reasons, possibly fall  risk? Fortunately patient's family denies any recent falls but now spending majority of time in wheelchair - given edema and hypoxia, would pursue LE venous duplex to exclude DVT - can continue anticoagulation until this is known  4. Irregular rhythm, also with significat sinus bradycardia - very challenging to discern if truly atrial fibrillation due to heavy baseline artifact and low P wave amplitude. When artifact is resolved, this more clearly shows NSR/sinus bradycardia therefore will review tracings with MD - carvedilol on hold due to significant bradycardia, may be able to restart at lower dose down the road - currently NSR in the 60s  5. Aortic insufficiency - moderate-severe by echocardiogram - will review plan with MD   6. Pericardial effusion -  known from previous, remains small-moderate, managed conservatively in the past by primary cardiologist, no evidence of tamponade at this time  7. Elevated troponin - low/flat, no angina, suspect demand process, would continue conservative approach - EF low-normal with similar wall motion abnormality as 02/2020  8. Anemia, normocytic - prior Hgb wnl so need to f/u CBC in AM - hemoccult stool - no bleeding reported    New York Heart Association (NYHA) Functional Class NYHA Class III  If atrial fib confirmed (will review with MD), CHADSVASC is 7 for CHF, HTN, age, stroke, female.  For questions or updates, please contact Tracy. Francois Please consult [email or web address] for contact info under    Signed, Charlie Pitter, PA-C  08/18/2020 3:16 PM   I have seen and examined the patient along with Charlie Pitter, PA-C.  I have reviewed the chart, notes and new data.  I agree with PA/NP's note.  Key new complaints: she is hungry. She is not short of breath or dizzy. She is lying fully flat without dyspnea. Memory problems are evident, but she is clearly comfortable. She gained at least 15 lb in fluid weight recently. Key examination changes:  JVP to angle of jaw, irregular bradycardic rhythm, no S3, barely audible diastolic murmur, 3+ soft symmetrical pitting edema to the hips. Note extremely wide pulse pressure with DBP in 30s and 40s. Key new findings / data: none of the ECGs shows atrial fibrillation. One has a very poor baseline, but there is clear grouped beating (cannot distinguish between bigeminal rhythm versus Wenckebach cycles due to the baseline noise). Another ECG shows severe bradycardia with distinct P waves. Most likely diagnosis is SA block, second degree, Mobitz type 1 (sometimes 2:1 with heart rate in the 30s, other times 3:2 with heart rate in 40s).  PLAN: Suspect she has heart failure due to the combination of moderate to severe aortic insufficiency and severe bradycardia (prolonged diastole worsening aortic insufficiency hemodynamic impact).  Stop the beta blocker. Can also consider stopping the Namenda if still bradycardic in 2-3 days (according to her husband, Jenny Reichmann, it has not made any difference in her memory). Continue the diuretics.  There is no indication for anticoagulation from the rhythm standpoint. She received an abbreviated course of anticoagulation for DVT a few months ago, cut short due to increased bleeding risk.  With her current cognitive and functional limitations I do not think that she is a candidate for AV replacement or even pacemaker therapy. I also encouraged the patient's husband and her daughter, Margarita Grizzle, to review the patient's code status. She is not in a position to make that decision for herself. Although I do not expect an imminent catastrophic decline, I encouraged them to change her status to DNR.      Sanda Klein, MD, [place] [phone] 08/18/2020, 4:28 PM

## 2020-08-18 NOTE — ED Notes (Signed)
Spoke to provider and made aware of diastolic BP.

## 2020-08-18 NOTE — Consult Note (Addendum)
[facility] Nurse Consult Note: Reason for Consult: Consult requested for left hip.  Pt is emaciated and immobile and has multiple systemic factors which can impair healing.  Wound type: Unstageable pressure injury to left hip; 100% tightly adhered black eschar, 8X5cm, no odor, drainage, or fluctuance. Surrounded by dark red-purple deep tissue pressure injury wound edges Pressure Injury POA: Yes Dressing procedure/placement/frequency: Family at the bedside to assess wound appearance and discuss plan of care. Topical treatment orders provided for bedside nurses to perform as follows to assist with enzymatic debridement: Apply Santyl to left hip wound Q day, then cover with moist gauze and foam dressing.  (Change foam dressing Q 3 days or PRN soiling.) Please re-consult if further assistance is needed.  Thank-you,  Julien Girt MSN, [place], [place], [place], [place]

## 2020-08-18 NOTE — Evaluation (Signed)
Physical Therapy Evaluation Patient Details Name: Tracy Valdez MRN: [id number] DOB: 27-Aug-1934 Today's Date: 08/18/2020   History of Present Illness  86 yo female presenting with SOB and BLE edema for 1 month. Hypoxic and required O2 in the ED, CXR with L pleural effusion, EKG with A-fib. Admitted with acute on chronic CHF decompensation, HTN emergency, new A-fib. PMH dementia, HTN, CVA  Clinical Impression   Patient received in bed, very lethargic but able to wake with tactile and verbal stimulation. Attempted rolling in bed for which she required totalA and became physically resistive. Unable to lift BLEs against gravity, unable to follow cues for MMT but would estimate 2+/5 at best, grossly. TotalA to reposition in bed. Family not present to provide PLOF or history of mobility, however given her dementia, gross weakness, and reluctance to participate in activity, I would assume she is either close to or at her functional baseline. Left in stretcher with all needs met. Again, feel she is most likely at or close to functional baseline given information in chart- PT signing off for now, please re-consult if acute rehab needs arise.     Follow Up Recommendations SNF;Supervision/Assistance - 24 hour    Equipment Recommendations  Other (comment) (defer to next venue)    Recommendations for Other Services       Precautions / Restrictions Precautions Precautions: Fall;Other (comment) Precaution Comments: watch vitals, dementia Restrictions Weight Bearing Restrictions: No      Mobility  Bed Mobility Overal bed mobility: Needs Assistance Bed Mobility: Rolling Rolling: Total assist         General bed mobility comments: totalA for partial rolling, then became physicall resistive  Transfers                 General transfer comment: deferred- refusal  Ambulation/Gait             General Gait Details: deferred- refusal  Stairs            Wheelchair Mobility     Modified Rankin (Stroke Patients Only)       Balance                                             Pertinent Vitals/Pain Pain Assessment: Faces Faces Pain Scale: Hurts little more Pain Location: generalized with attempts at mobility Pain Descriptors / Indicators: Discomfort Pain Intervention(s): Limited activity within patient's tolerance;Monitored during session;Repositioned    Home Living Family/patient expects to be discharged to:: Skilled nursing facility                 Additional Comments: patient not able to provide accurate details/poor historian and husband not present. Per chart/MD note, has been WC bound since the summer with declining function at her SNF.    Prior Function Level of Independence: Needs assistance   Gait / Transfers Assistance Needed: WC bound, unsure how much assist needed for transfers  ADL's / Homemaking Assistance Needed: unsure but possiby dependent  Comments: will need to clarify with spouse     Hand Dominance        Extremity/Trunk Assessment   Upper Extremity Assessment Upper Extremity Assessment: Generalized weakness    Lower Extremity Assessment Lower Extremity Assessment: Generalized weakness    Cervical / Trunk Assessment Cervical / Trunk Assessment: Kyphotic;Other exceptions Cervical / Trunk Exceptions: L cervical lateral flexion  Communication   Communication:  No difficulties  Cognition Arousal/Alertness: Lethargic Behavior During Therapy: WFL for tasks assessed/performed Overall Cognitive Status: History of cognitive impairments - at baseline                                 General Comments: hx of dementia at baseline, not able to tell me her birthday/place/situation/time. Husband not present to clarify if this is baseline.      General Comments General comments (skin integrity, edema, etc.): unable to get to EOB to assess balance at eval    Exercises     Assessment/Plan     PT Assessment All further PT needs can be met in the next venue of care  PT Problem List Decreased strength;Decreased activity tolerance;Decreased safety awareness;Decreased balance;Decreased mobility;Decreased coordination       PT Treatment Interventions      PT Goals (Current goals can be found in the Care Plan section)  Acute Rehab PT Goals PT Goal Formulation: Patient unable to participate in goal setting Time For Goal Achievement:  Potential to Achieve Goals: Fair    Frequency     Barriers to discharge        Co-evaluation               AM-PAC PT "6 Clicks" Mobility  Outcome Measure Help needed turning from your back to your side while in a flat bed without using bedrails?: Total Help needed moving from lying on your back to sitting on the side of a flat bed without using bedrails?: Total Help needed moving to and from a bed to a chair (including a wheelchair)?: Total Help needed standing up from a chair using your arms (e.g., wheelchair or bedside chair)?: Total Help needed to walk in hospital room?: Total Help needed climbing 3-5 steps with a railing? : Total 6 Click Score: 6    End of Session Equipment Utilized During Treatment: Oxygen Activity Tolerance: Patient limited by lethargy Patient left: in bed;with call bell/phone within reach (ED stretcher) Nurse Communication: Mobility status PT Visit Diagnosis: Muscle weakness (generalized) (M62.81);Other abnormalities of gait and mobility (R26.89)    Time: 4034-7425 PT Time Calculation (min) (ACUTE ONLY): 9 min   Charges:   PT Evaluation $PT Eval Moderate Complexity: 1 Mod          Windell Norfolk, DPT, PN1   Supplemental Physical Therapist [place]    Pager [phone] Acute Rehab Office [phone]

## 2020-08-18 NOTE — ED Notes (Signed)
Spoke to Dr. Marlowe Sax, notify if patient becomes bradycardic or hypotensive. Holding dose of morning coreg and rpt ekg.

## 2020-08-18 NOTE — ED Notes (Signed)
Patient HR dropped, hospitalist paged no response.

## 2020-08-19 ENCOUNTER — Inpatient Hospital Stay (HOSPITAL_COMMUNITY): Payer: Medicare Other

## 2020-08-19 DIAGNOSIS — J9601 Acute respiratory failure with hypoxia: Secondary | ICD-10-CM | POA: Diagnosis not present

## 2020-08-19 DIAGNOSIS — I351 Nonrheumatic aortic (valve) insufficiency: Secondary | ICD-10-CM

## 2020-08-19 DIAGNOSIS — I455 Other specified heart block: Secondary | ICD-10-CM

## 2020-08-19 DIAGNOSIS — R609 Edema, unspecified: Secondary | ICD-10-CM | POA: Diagnosis not present

## 2020-08-19 DIAGNOSIS — F0390 Unspecified dementia without behavioral disturbance: Secondary | ICD-10-CM

## 2020-08-19 DIAGNOSIS — I5033 Acute on chronic diastolic (congestive) heart failure: Secondary | ICD-10-CM | POA: Diagnosis not present

## 2020-08-19 DIAGNOSIS — I509 Heart failure, unspecified: Secondary | ICD-10-CM | POA: Diagnosis not present

## 2020-08-19 LAB — BASIC METABOLIC PANEL
Anion gap: 12 (ref 5–15)
BUN: 29 mg/dL — ABNORMAL HIGH (ref 8–23)
CO2: 21 mmol/L — ABNORMAL LOW (ref 22–32)
Calcium: 8 mg/dL — ABNORMAL LOW (ref 8.9–10.3)
Chloride: 103 mmol/L (ref 98–111)
Creatinine, Ser: 1.36 mg/dL — ABNORMAL HIGH (ref 0.44–1.00)
GFR, Estimated: 36 mL/min — ABNORMAL LOW (ref >60)
Glucose, Bld: 107 mg/dL — ABNORMAL HIGH (ref 70–99)
Potassium: 4 mmol/L (ref 3.5–5.1)
Sodium: 136 mmol/L (ref 135–145)

## 2020-08-19 LAB — HEPATIC FUNCTION PANEL
ALT: 14 U/L (ref 0–44)
AST: 25 U/L (ref 15–41)
Albumin: 2.1 g/dL — ABNORMAL LOW (ref 3.5–5.0)
Alkaline Phosphatase: 65 U/L (ref 38–126)
Bilirubin, Direct: 0.2 mg/dL (ref 0.0–0.2)
Indirect Bilirubin: 0.8 mg/dL (ref 0.3–0.9)
Total Bilirubin: 1 mg/dL (ref 0.3–1.2)
Total Protein: 4.4 g/dL — ABNORMAL LOW (ref 6.5–8.1)

## 2020-08-19 LAB — CBC
HCT: 34.1 % — ABNORMAL LOW (ref 36.0–46.0)
Hemoglobin: 10.7 g/dL — ABNORMAL LOW (ref 12.0–15.0)
MCH: 30.3 pg (ref 26.0–34.0)
MCHC: 31.4 g/dL (ref 30.0–36.0)
MCV: 96.6 fL (ref 80.0–100.0)
Platelets: 340 10*3/uL (ref 150–400)
RBC: 3.53 MIL/uL — ABNORMAL LOW (ref 3.87–5.11)
RDW: 13.6 % (ref 11.5–15.5)
WBC: 10.1 10*3/uL (ref 4.0–10.5)
nRBC: 0 % (ref 0.0–0.2)

## 2020-08-19 LAB — MRSA PCR SCREENING: MRSA by PCR: POSITIVE — AB

## 2020-08-19 MED ORDER — CHLORHEXIDINE GLUCONATE CLOTH 2 % EX PADS
6.0000 | MEDICATED_PAD | Freq: Every day | CUTANEOUS | Status: DC
  Administered 2020-08-20: 6 via TOPICAL

## 2020-08-19 MED ORDER — ENOXAPARIN SODIUM 30 MG/0.3ML ~~LOC~~ SOLN
30.0000 mg | SUBCUTANEOUS | Status: DC
  Administered 2020-08-19 – 2020-08-20 (×2): 30 mg via SUBCUTANEOUS
  Filled 2020-08-19 (×2): qty 0.3

## 2020-08-19 MED ORDER — MUPIROCIN 2 % EX OINT
1.0000 "application " | TOPICAL_OINTMENT | Freq: Two times a day (BID) | CUTANEOUS | Status: DC
  Administered 2020-08-19 – 2020-08-20 (×3): 1 via NASAL
  Filled 2020-08-19: qty 22

## 2020-08-19 NOTE — Progress Notes (Addendum)
[facility] PROGRESS NOTE    Progress Note  Tracy Valdez  MGQ:[id number] DOB: 1933-12-07 DOA: 08/17/2020 PCP: Curly Rim, MD     Brief Narrative:   Tracy Valdez is an 86 y.o. female past medical history of essential hypertension, DVT no longer on anticoagulation, chronic diastolic heart failure, chronic kidney disease, history of right-sided stroke with left residual weakness, with ambulatory dysfunction who resides at a skilled nursing facility presents with shortness of breath and lower extremity edema found to be in acute decompensated heart failure.  Assessment/Plan:   Active Problems:   CHF (congestive heart failure) (HCC) Acute hypoxic respiratory failure secondarily to acute decompensated diastolic heart failure: He was started on IV Lasix, she had gained at least 15 pounds of fluid compared to recent evaluation. She has lower extremity edema, but I cannot appreciate any JVD. 2D echo done on 08/17/2020 showed a preserved EF of 55% some wall motion abnormalities, with mildly dilated left ventricle with hypertrophy diastolic parameters were indeterminate severely dilated left atrium, and moderate pericardial effusion.  Aortic valve regurgitation is moderate to severe, no evidence of stenosis.  And there was greater than 50% variability of the inferior vena cava. Continue strict I's and O's, her urine appears to be concentrated.  Poorly recorded continue IV diuresis per cardiology.  Essential hypertension: Continue Avapro and hydralazine.  Seems to be relatively stable.  Sinus bradycardia: Appears to be nocturnal TSH within normal.  Paroxysmal atrial fibrillation: Rate control.  Severe aortic regurgitation: Cardiology was consulted who recommended to stop beta-blockers and Namenda.  There is no indication for anticoagulation at this point.  She did receive a repeated course of anticoagulation for DVT few months ago due to increased risk of bleeding. She is not a  candidate for surgical intervention due to her cognitive and functional limitations.  Chronic kidney disease stage II: Creatinine appears to be at baseline.  History of DVT: She received an abbreviated course of Xarelto for DVT.  RN Pressure Injury Documentation: Pressure Injury 08/18/20 Hip Left Unstageable - Full thickness tissue loss in which the base of the injury is covered by slough (yellow, tan, gray, green or brown) and/or eschar (tan, brown or black) in the wound bed. (Active)  08/18/20   Location: Hip  Location Orientation: Left  Staging: Unstageable - Full thickness tissue loss in which the base of the injury is covered by slough (yellow, tan, gray, green or brown) and/or eschar (tan, brown or black) in the wound bed.  Wound Description (Comments):   Present on Admission: Yes    Estimated body mass index is 22.58 kg/m as calculated from the following:   Height as of 03/21/20: 5\' 1"  (1.549 m).   Weight as of this encounter: 54.2 kg.   DVT prophylaxis: Xarelto Family Communication:none Status is: Inpatient  Remains inpatient appropriate because:Hemodynamically unstable   Dispo: The patient is from: SNF              Anticipated d/c is to: SNF              Anticipated d/c date is: > 3 days              Patient currently is not medically stable to d/c.        Code Status:     Code Status Orders  (From admission, onward)         Start     Ordered   08/17/20 1433  Full code  Continuous  08/17/20 1435        Code Status History    This patient has a current code status but no historical code status.   Advance Care Planning Activity        IV Access:    Peripheral IV   Procedures and diagnostic studies:   DG Chest 2 View  Result Date: 08/17/2020 CLINICAL DATA:  Status post fall. EXAM: CHEST - 2 VIEW COMPARISON:  August 18, 2019. FINDINGS: Stable cardiomegaly. No pneumothorax is noted. Mild left pleural effusion is noted with probable  associated left basilar atelectasis or infiltrate. Central pulmonary vascular congestion is noted with possible bilateral perihilar edema. Bony thorax is unremarkable. IMPRESSION: Mild left pleural effusion with probable associated left basilar atelectasis or infiltrate. Central pulmonary vascular congestion with possible bilateral perihilar edema. Aortic Atherosclerosis (ICD10-I70.0). Electronically Signed   By: Marijo Conception M.D.   On: 08/17/2020 12:19   DG Pelvis 1-2 Views  Result Date: 08/17/2020 CLINICAL DATA:  Fall. EXAM: PELVIS - 1-2 VIEW COMPARISON:  August 18, 2019. FINDINGS: There is no evidence of pelvic fracture or diastasis. No pelvic bone lesions are seen. IMPRESSION: Negative. Electronically Signed   By: Marijo Conception M.D.   On: 08/17/2020 12:21   DG Chest Port 1 View  Result Date: 08/18/2020 CLINICAL DATA:  Congestive failure EXAM: PORTABLE CHEST 1 VIEW COMPARISON:  08/17/2020 FINDINGS: Cardiac shadow is enlarged in size. Aortic calcifications are noted. Increasing vascular congestion is noted with mild interstitial edema. Bilateral pleural effusions are noted left greater than right. No bony abnormality is seen. IMPRESSION: CHF with bilateral effusions left greater than right. The overall appearance is stable from the prior exam. Electronically Signed   By: Inez Catalina M.D.   On: 08/18/2020 15:12   ECHOCARDIOGRAM COMPLETE  Result Date: 08/17/2020    ECHOCARDIOGRAM REPORT   Patient Name:   Tracy Valdez Date of Exam: 08/17/2020 Medical Rec #:  [id number]     Height:       61.0 in Accession #:    [id number]    Weight:       107.0 lb Date of Birth:  [birth year]      BSA:          1.448 m Patient Age:    44 years      BP:           195/26 mmHg Patient Gender: F             HR:           79 bpm. Exam Location:  Inpatient Procedure: 2D Echo Indications:    acute diastolic CHF 390.30  History:        Patient has no prior history of Echocardiogram examinations.  Sonographer:    Johny Chess Referring Phys: [id number] [place]  1. Akinesis of the basal inferolateral wall with overall low normal LV function; mild LVH; mild LVE; calcified aortic valve with moderate to severe AI; severe LAE; small to moderate pericardial effusion.  2. Left ventricular ejection fraction, by estimation, is 50 to 55%. The left ventricle has low normal function. The left ventricle demonstrates regional wall motion abnormalities (see scoring diagram/findings for description). The left ventricular internal cavity size was mildly dilated. There is mild left ventricular hypertrophy. Left ventricular diastolic parameters are indeterminate.  3. Right ventricular systolic function is normal. The right ventricular size is normal. There is moderately elevated pulmonary artery systolic pressure.  4. Left atrial size  was severely dilated.  5. Moderate pericardial effusion. Moderate pleural effusion in the left lateral region.  6. The mitral valve is normal in structure. Trivial mitral valve regurgitation. No evidence of mitral stenosis. Moderate mitral annular calcification.  7. The aortic valve is tricuspid. Aortic valve regurgitation is moderate to severe. Mild to moderate aortic valve sclerosis/calcification is present, without any evidence of aortic stenosis.  8. The inferior vena cava is normal in size with greater than 50% respiratory variability, suggesting right atrial pressure of 3 mmHg. FINDINGS  Left Ventricle: Left ventricular ejection fraction, by estimation, is 50 to 55%. The left ventricle has low normal function. The left ventricle demonstrates regional wall motion abnormalities. The left ventricular internal cavity size was mildly dilated. There is mild left ventricular hypertrophy. Left ventricular diastolic parameters are indeterminate. Right Ventricle: The right ventricular size is normal.Right ventricular systolic function is normal. There is moderately elevated pulmonary artery systolic  pressure. The tricuspid regurgitant velocity is 3.38 m/s, and with an assumed right atrial pressure of 3 mmHg, the estimated right ventricular systolic pressure is 71.6 mmHg. Left Atrium: Left atrial size was severely dilated. Right Atrium: Right atrial size was normal in size. Pericardium: A moderately sized pericardial effusion is present. Mitral Valve: The mitral valve is normal in structure. Moderate mitral annular calcification. Trivial mitral valve regurgitation. No evidence of mitral valve stenosis. Tricuspid Valve: The tricuspid valve is normal in structure. Tricuspid valve regurgitation is mild . No evidence of tricuspid stenosis. Aortic Valve: The aortic valve is tricuspid. Aortic valve regurgitation is moderate to severe. Mild to moderate aortic valve sclerosis/calcification is present, without any evidence of aortic stenosis. Pulmonic Valve: The pulmonic valve was not well visualized. Pulmonic valve regurgitation is mild. No evidence of pulmonic stenosis. Aorta: The aortic root is normal in size and structure. Venous: The inferior vena cava is normal in size with greater than 50% respiratory variability, suggesting right atrial pressure of 3 mmHg. IAS/Shunts: No atrial level shunt detected by color flow Doppler. Additional Comments: Akinesis of the basal inferolateral wall with overall low normal LV function; mild LVH; mild LVE; calcified aortic valve with moderate to severe AI; severe LAE; small to moderate pericardial effusion. There is a moderate pleural effusion in the left lateral region.  LEFT VENTRICLE PLAX 2D LVIDd:         5.40 cm LVIDs:         4.30 cm LV PW:         0.90 cm LV IVS:        1.10 cm LVOT diam:     1.90 cm LVOT Area:     2.84 cm  RIGHT VENTRICLE             IVC RV S prime:     13.40 cm/s  IVC diam: 1.60 cm LEFT ATRIUM             Index       RIGHT ATRIUM           Index LA diam:        4.30 cm 2.97 cm/m  RA Area:     13.20 cm LA Vol (A2C):   73.4 ml 50.70 ml/m RA Volume:    29.30 ml  20.24 ml/m LA Vol (A4C):   74.1 ml 51.18 ml/m LA Biplane Vol: 74.1 ml 51.18 ml/m   AORTA Ao Root diam: 3.30 cm Ao Asc diam:  3.30 cm TRICUSPID VALVE TR Peak grad:   45.7 mmHg TR Vmax:  338.00 cm/s  SHUNTS Systemic Diam: 1.90 cm Kirk Ruths MD Electronically signed by Kirk Ruths MD Signature Date/Time: 08/17/2020/5:14:12 PM    Final    DG FEMUR, MIN 2 VIEWS RIGHT  Result Date: 08/17/2020 CLINICAL DATA:  Fall. EXAM: RIGHT FEMUR 2 VIEWS COMPARISON:  None. FINDINGS: There is no evidence of fracture or other focal bone lesions. Soft tissues are unremarkable. IMPRESSION: Negative. Electronically Signed   By: Marijo Conception M.D.   On: 08/17/2020 12:22     Medical Consultants:    None.  Anti-Infectives:   None  Subjective:    Tracy Valdez she has no difficult breathing this morning.  Objective:    Vitals:   08/19/20 0029 08/19/20 0402 08/19/20 0829 08/19/20 0906  BP: (!) 152/62 (!) 165/74 (!) 148/68 (!) 155/36  Pulse: 62 (!) 58 64 64  Resp: 18 15 18    Temp: 97.8 F (36.6 C) 99.4 F (37.4 C) 98.1 F (36.7 C)   TempSrc: Oral Oral Oral   SpO2: 98% 99% 99% 99%  Weight:  54.2 kg     SpO2: 99 % O2 Flow Rate (L/min): 2 L/min   Intake/Output Summary (Last 24 hours) at 08/19/2020 0927 Last data filed at 08/19/2020 0400 Gross per 24 hour  Intake 400 ml  Output 500 ml  Net -100 ml   Filed Weights   08/19/20 0402  Weight: 54.2 kg    Exam: General exam: In no acute distress. Respiratory system: Good air movement and some mild crackles at bases. Cardiovascular system: S1 & S2 heard, RRR.  Cannot appreciate any JVD. Gastrointestinal system: Abdomen is nondistended, soft and nontender.  Extremities: 3+ lower extremity edema. Skin: Multiple small ulcers on the left foot  Data Reviewed:    Labs: Basic Metabolic Panel: Recent Labs  Lab 08/17/20 1236 08/17/20 1236 08/18/20 0212 08/19/20 0432  NA 137  --  136 136  K 4.2   < > 3.6 4.0  CL 106   --  105 103  CO2 21*  --  20* 21*  GLUCOSE 139*  --  102* 107*  BUN 22  --  24* 29*  CREATININE 1.10*  --  1.16* 1.36*  CALCIUM 7.8*  --  7.5* 8.0*   < > = values in this interval not displayed.   GFR Estimated Creatinine Clearance: 23.2 mL/min (A) (by C-G formula based on SCr of 1.36 mg/dL (H)). Liver Function Tests: Recent Labs  Lab 08/19/20 0432  AST 25  ALT 14  ALKPHOS 65  BILITOT 1.0  PROT 4.4*  ALBUMIN 2.1*   No results for input(s): LIPASE, AMYLASE in the last 168 hours. No results for input(s): AMMONIA in the last 168 hours. Coagulation profile No results for input(s): INR, PROTIME in the last 168 hours. COVID-19 Labs  No results for input(s): DDIMER, FERRITIN, LDH, CRP in the last 72 hours.  Lab Results  Component Value Date   [place] NEGATIVE 08/17/2020    CBC: Recent Labs  Lab 08/17/20 1236 08/19/20 0432  WBC 9.5 10.1  HGB 10.0* 10.7*  HCT 32.9* 34.1*  MCV 98.2 96.6  PLT 282 340   Cardiac Enzymes: No results for input(s): CKTOTAL, CKMB, CKMBINDEX, TROPONINI in the last 168 hours. BNP (last 3 results) No results for input(s): PROBNP in the last 8760 hours. CBG: No results for input(s): GLUCAP in the last 168 hours. D-Dimer: No results for input(s): DDIMER in the last 72 hours. Hgb A1c: No results for input(s): HGBA1C in the last  72 hours. Lipid Profile: No results for input(s): CHOL, HDL, LDLCALC, TRIG, CHOLHDL, LDLDIRECT in the last 72 hours. Thyroid function studies: Recent Labs    08/18/20 0534  TSH 2.979   Anemia work up: No results for input(s): VITAMINB12, FOLATE, FERRITIN, TIBC, IRON, RETICCTPCT in the last 72 hours. Sepsis Labs: Recent Labs  Lab 08/17/20 1236 08/19/20 0432  WBC 9.5 10.1   Microbiology Recent Results (from the past 240 hour(s))  Respiratory Panel by RT PCR (Flu A&B, Covid) - Nasopharyngeal Swab     Status: None   Collection Time: 08/17/20 12:37 PM   Specimen: Nasopharyngeal Swab  Result Value Ref Range  Status   SARS Coronavirus 2 by RT PCR NEGATIVE NEGATIVE Final    Comment: (NOTE) SARS-CoV-2 target nucleic acids are NOT DETECTED.  The SARS-CoV-2 RNA is generally detectable in upper respiratoy specimens during the acute phase of infection. The lowest concentration of SARS-CoV-2 viral copies this assay can detect is 131 copies/mL. A negative result does not preclude SARS-Cov-2 infection and should not be used as the sole basis for treatment or other patient management decisions. A negative result may occur with  improper specimen collection/handling, submission of specimen other than nasopharyngeal swab, presence of viral mutation(s) within the areas targeted by this assay, and inadequate number of viral copies (<131 copies/mL). A negative result must be combined with clinical observations, patient history, and epidemiological information. The expected result is Negative.  Fact Sheet for Patients:  PinkCheek.be  Fact Sheet for Healthcare Providers:  GravelBags.it  This test is no t yet approved or cleared by the [place] FDA and  has been authorized for detection and/or diagnosis of SARS-CoV-2 by FDA under an Emergency Use Authorization (EUA). This EUA will remain  in effect (meaning this test can be used) for the duration of the COVID-19 declaration under Section 564(b)(1) of the Act, 21 U.S.C. section 360bbb-3(b)(1), unless the authorization is terminated or revoked sooner.     Influenza A by PCR NEGATIVE NEGATIVE Final   Influenza B by PCR NEGATIVE NEGATIVE Final    Comment: (NOTE) The Xpert Xpress SARS-CoV-2/FLU/RSV assay is intended as an aid in  the diagnosis of influenza from Nasopharyngeal swab specimens and  should not be used as a sole basis for treatment. Nasal washings and  aspirates are unacceptable for Xpert Xpress SARS-CoV-2/FLU/RSV  testing.  Fact Sheet for  Patients: PinkCheek.be  Fact Sheet for Healthcare Providers: GravelBags.it  This test is not yet approved or cleared by the [place] FDA and  has been authorized for detection and/or diagnosis of SARS-CoV-2 by  FDA under an Emergency Use Authorization (EUA). This EUA will remain  in effect (meaning this test can be used) for the duration of the  Covid-19 declaration under Section 564(b)(1) of the Act, 21  U.S.C. section 360bbb-3(b)(1), unless the authorization is  terminated or revoked. Performed at [facility], [place] [address], [place], [place] [address]      Medications:   . atorvastatin  20 mg Oral Daily  . collagenase   Topical Daily  . ferrous sulfate  325 mg Oral Q breakfast  . furosemide  40 mg Intravenous Daily  . hydrALAZINE  100 mg Oral TID  . irbesartan  150 mg Oral Daily  . memantine  10 mg Oral Daily  . Rivaroxaban  15 mg Oral Q supper  . sodium chloride flush  3 mL Intravenous Q12H  . vitamin B-12  100 mcg Oral Daily   Continuous Infusions: .  sodium chloride        LOS: 2 days   Charlynne Cousins  [facility]  08/19/2020, 9:27 AM

## 2020-08-19 NOTE — Progress Notes (Signed)
Progress Note  Patient Name: Tracy Valdez Date of Encounter: 08/19/2020  [place] Cardiologist: No primary care provider on file. ([facility])  Subjective   Seems more alert and interactive. Denies dyspnea. Still markedly edematous. UO not fully recorded. Weight 119 lb today. Weight was 104 lb at Neurology visit on 08/23, 107 lb at Cardiology OP visit 06/08. Bradycardia has improved. Carefully reviewed telemetry for atrial fibrillation - none is seen (irregular rhythm due to SA block, 2nd degree MT1 and frequent PACs).  Inpatient Medications    Scheduled Meds: . atorvastatin  20 mg Oral Daily  . collagenase   Topical Daily  . enoxaparin (LOVENOX) injection  30 mg Subcutaneous Q24H  . ferrous sulfate  325 mg Oral Q breakfast  . furosemide  40 mg Intravenous Daily  . hydrALAZINE  100 mg Oral TID  . irbesartan  150 mg Oral Daily  . memantine  10 mg Oral Daily  . sodium chloride flush  3 mL Intravenous Q12H  . vitamin B-12  100 mcg Oral Daily   Continuous Infusions: . sodium chloride     PRN Meds: sodium chloride, acetaminophen, hydrALAZINE, ondansetron (ZOFRAN) IV, sodium chloride flush   Vital Signs    Vitals:   08/19/20 0029 08/19/20 0402 08/19/20 0829 08/19/20 0906  BP: (!) 152/62 (!) 165/74 (!) 148/68 (!) 155/36  Pulse: 62 (!) 58 64 64  Resp: 18 15 18    Temp: 97.8 F (36.6 C) 99.4 F (37.4 C) 98.1 F (36.7 C)   TempSrc: Oral Oral Oral   SpO2: 98% 99% 99% 99%  Weight:  54.2 kg      Intake/Output Summary (Last 24 hours) at 08/19/2020 1225 Last data filed at 08/19/2020 0400 Gross per 24 hour  Intake 400 ml  Output 500 ml  Net -100 ml   Last 3 Weights 08/19/2020 03/21/2020 01/02/2020  Weight (lbs) 119 lb 7.8 oz 107 lb 107 lb  Weight (kg) 54.2 kg 48.535 kg 48.535 kg      Telemetry    Sinus rhythm with frequent PACs (often bigeminy) and periods of SA Wenckebach. There is no atrial fibrillation. - Personally Reviewed  ECG    No new tracing -  Personally Reviewed  Physical Exam  Eldely and frail. Disoriented - asks "where am I?" GEN: No acute distress.   Neck: 10 cm JVD Cardiac: RRR with frequent irregularity, Ao ejection murmur 2-3/5, diastolic decrescendo murmur 1/6, no rubs, or gallops. Hyperdynamic peripheral pulses Respiratory: Clear to auscultation bilaterally. GI: Soft, nontender, non-distended  MS: 2+ symmetrical pitting edema 2/3 to knees; No deformity. Neuro:  Nonfocal  Psych: Normal affect   Labs    High Sensitivity Troponin:   Recent Labs  Lab 08/17/20 1236 08/17/20 1257 08/18/20 0212  TROPONINIHS 83* 93* 84*      Chemistry Recent Labs  Lab 08/17/20 1236 08/18/20 0212 08/19/20 0432  NA 137 136 136  K 4.2 3.6 4.0  CL 106 105 103  CO2 21* 20* 21*  GLUCOSE 139* 102* 107*  BUN 22 24* 29*  CREATININE 1.10* 1.16* 1.36*  CALCIUM 7.8* 7.5* 8.0*  PROT  --   --  4.4*  ALBUMIN  --   --  2.1*  AST  --   --  25  ALT  --   --  14  ALKPHOS  --   --  65  BILITOT  --   --  1.0  GFRNONAA 46* 43* 36*  ANIONGAP 10 11 12  Hematology Recent Labs  Lab 08/17/20 1236 08/19/20 0432  WBC 9.5 10.1  RBC 3.35* 3.53*  HGB 10.0* 10.7*  HCT 32.9* 34.1*  MCV 98.2 96.6  MCH 29.9 30.3  MCHC 30.4 31.4  RDW 13.4 13.6  PLT 282 340    BNP Recent Labs  Lab 08/17/20 1236  BNP 3,923.3*     DDimer No results for input(s): DDIMER in the last 168 hours.   Radiology    DG Chest Port 1 View  Result Date: 08/18/2020 CLINICAL DATA:  Congestive failure EXAM: PORTABLE CHEST 1 VIEW COMPARISON:  08/17/2020 FINDINGS: Cardiac shadow is enlarged in size. Aortic calcifications are noted. Increasing vascular congestion is noted with mild interstitial edema. Bilateral pleural effusions are noted left greater than right. No bony abnormality is seen. IMPRESSION: CHF with bilateral effusions left greater than right. The overall appearance is stable from the prior exam. Electronically Signed   By: Inez Catalina M.D.   On:  08/18/2020 15:12   ECHOCARDIOGRAM COMPLETE  Result Date: 08/17/2020    ECHOCARDIOGRAM REPORT   Patient Name:   Tracy Valdez Date of Exam: 08/17/2020 Medical Rec #:  [id number]     Height:       61.0 in Accession #:    [id number]    Weight:       107.0 lb Date of Birth:  [birth year]      BSA:          1.448 m Patient Age:    86 years      BP:           195/26 mmHg Patient Gender: F             HR:           79 bpm. Exam Location:  Inpatient Procedure: 2D Echo Indications:    acute diastolic CHF 962.22  History:        Patient has no prior history of Echocardiogram examinations.  Sonographer:    Johny Chess Referring Phys: [id number] [place]  1. Akinesis of the basal inferolateral wall with overall low normal LV function; mild LVH; mild LVE; calcified aortic valve with moderate to severe AI; severe LAE; small to moderate pericardial effusion.  2. Left ventricular ejection fraction, by estimation, is 50 to 55%. The left ventricle has low normal function. The left ventricle demonstrates regional wall motion abnormalities (see scoring diagram/findings for description). The left ventricular internal cavity size was mildly dilated. There is mild left ventricular hypertrophy. Left ventricular diastolic parameters are indeterminate.  3. Right ventricular systolic function is normal. The right ventricular size is normal. There is moderately elevated pulmonary artery systolic pressure.  4. Left atrial size was severely dilated.  5. Moderate pericardial effusion. Moderate pleural effusion in the left lateral region.  6. The mitral valve is normal in structure. Trivial mitral valve regurgitation. No evidence of mitral stenosis. Moderate mitral annular calcification.  7. The aortic valve is tricuspid. Aortic valve regurgitation is moderate to severe. Mild to moderate aortic valve sclerosis/calcification is present, without any evidence of aortic stenosis.  8. The inferior vena cava is normal in size with  greater than 50% respiratory variability, suggesting right atrial pressure of 3 mmHg. FINDINGS  Left Ventricle: Left ventricular ejection fraction, by estimation, is 50 to 55%. The left ventricle has low normal function. The left ventricle demonstrates regional wall motion abnormalities. The left ventricular internal cavity size was mildly dilated. There is mild left ventricular hypertrophy. Left ventricular  diastolic parameters are indeterminate. Right Ventricle: The right ventricular size is normal.Right ventricular systolic function is normal. There is moderately elevated pulmonary artery systolic pressure. The tricuspid regurgitant velocity is 3.38 m/s, and with an assumed right atrial pressure of 3 mmHg, the estimated right ventricular systolic pressure is 64.4 mmHg. Left Atrium: Left atrial size was severely dilated. Right Atrium: Right atrial size was normal in size. Pericardium: A moderately sized pericardial effusion is present. Mitral Valve: The mitral valve is normal in structure. Moderate mitral annular calcification. Trivial mitral valve regurgitation. No evidence of mitral valve stenosis. Tricuspid Valve: The tricuspid valve is normal in structure. Tricuspid valve regurgitation is mild . No evidence of tricuspid stenosis. Aortic Valve: The aortic valve is tricuspid. Aortic valve regurgitation is moderate to severe. Mild to moderate aortic valve sclerosis/calcification is present, without any evidence of aortic stenosis. Pulmonic Valve: The pulmonic valve was not well visualized. Pulmonic valve regurgitation is mild. No evidence of pulmonic stenosis. Aorta: The aortic root is normal in size and structure. Venous: The inferior vena cava is normal in size with greater than 50% respiratory variability, suggesting right atrial pressure of 3 mmHg. IAS/Shunts: No atrial level shunt detected by color flow Doppler. Additional Comments: Akinesis of the basal inferolateral wall with overall low normal LV  function; mild LVH; mild LVE; calcified aortic valve with moderate to severe AI; severe LAE; small to moderate pericardial effusion. There is a moderate pleural effusion in the left lateral region.  LEFT VENTRICLE PLAX 2D LVIDd:         5.40 cm LVIDs:         4.30 cm LV PW:         0.90 cm LV IVS:        1.10 cm LVOT diam:     1.90 cm LVOT Area:     2.84 cm  RIGHT VENTRICLE             IVC RV S prime:     13.40 cm/s  IVC diam: 1.60 cm LEFT ATRIUM             Index       RIGHT ATRIUM           Index LA diam:        4.30 cm 2.97 cm/m  RA Area:     13.20 cm LA Vol (A2C):   73.4 ml 50.70 ml/m RA Volume:   29.30 ml  20.24 ml/m LA Vol (A4C):   74.1 ml 51.18 ml/m LA Biplane Vol: 74.1 ml 51.18 ml/m   AORTA Ao Root diam: 3.30 cm Ao Asc diam:  3.30 cm TRICUSPID VALVE TR Peak grad:   45.7 mmHg TR Vmax:        338.00 cm/s  SHUNTS Systemic Diam: 1.90 cm Kirk Ruths MD Electronically signed by Kirk Ruths MD Signature Date/Time: 08/17/2020/5:14:12 PM    Final    VAS [place] LOWER EXTREMITY VENOUS (DVT)  Result Date: 08/19/2020  Lower Venous DVTStudy Indications: Edema.  Limitations: Poor ultrasound/tissue interface and altered mental status- unable to reposition. Comparison Study: 03-21-2020 Prior LT lower extremity venous study available. Performing Technologist: Darlin Coco  Examination Guidelines: A complete evaluation includes B-mode imaging, spectral Doppler, color Doppler, and power Doppler as needed of all accessible portions of each vessel. Bilateral testing is considered an integral part of a complete examination. Limited examinations for reoccurring indications may be performed as noted. The reflux portion of the exam is performed with the patient in reverse  Trendelenburg.  +---------+---------------+---------+-----------+----------+-------------------+ RIGHT    CompressibilityPhasicitySpontaneityPropertiesThrombus Aging       +---------+---------------+---------+-----------+----------+-------------------+ CFV      Full           Yes      Yes                                      +---------+---------------+---------+-----------+----------+-------------------+ SFJ      Full                                                             +---------+---------------+---------+-----------+----------+-------------------+ FV Prox  Full                                                             +---------+---------------+---------+-----------+----------+-------------------+ FV Mid   Full                                                             +---------+---------------+---------+-----------+----------+-------------------+ FV DistalFull                                                             +---------+---------------+---------+-----------+----------+-------------------+ PFV      Full                                                             +---------+---------------+---------+-----------+----------+-------------------+ POP      Full           Yes      Yes                                      +---------+---------------+---------+-----------+----------+-------------------+ PTV      Full                                         Some segments not                                                         visualized          +---------+---------------+---------+-----------+----------+-------------------+ PERO     Full  Some segments not                                                         visualized          +---------+---------------+---------+-----------+----------+-------------------+   +---------+---------------+---------+-----------+----------+--------------+ LEFT     CompressibilityPhasicitySpontaneityPropertiesThrombus Aging +---------+---------------+---------+-----------+----------+--------------+ CFV      Full            Yes      Yes                                 +---------+---------------+---------+-----------+----------+--------------+ SFJ      Full                                                        +---------+---------------+---------+-----------+----------+--------------+ FV Prox  Full                                                        +---------+---------------+---------+-----------+----------+--------------+ FV Mid   Full                                                        +---------+---------------+---------+-----------+----------+--------------+ FV DistalFull                                                        +---------+---------------+---------+-----------+----------+--------------+ PFV      Full                                                        +---------+---------------+---------+-----------+----------+--------------+ POP                                                   Not visualized +---------+---------------+---------+-----------+----------+--------------+ PTV                                                   Not visualized +---------+---------------+---------+-----------+----------+--------------+ PERO  Not visualized +---------+---------------+---------+-----------+----------+--------------+     Summary: RIGHT: - There is no evidence of deep vein thrombosis in the lower extremity. However, portions of this examination were limited- see technologist comments above.  LEFT: - There is no evidence of deep vein thrombosis in the lower extremity. However, portions of this examination were limited- see technologist comments above.  *See table(s) above for measurements and observations.    Preliminary     Cardiac Studies   1. Akinesis of the basal inferolateral wall with overall low normal LV  function; mild LVH; mild LVE; calcified aortic valve with moderate to  severe AI; severe  LAE; small to moderate pericardial effusion.  2. Left ventricular ejection fraction, by estimation, is 50 to 55%. The  left ventricle has low normal function. The left ventricle demonstrates  regional wall motion abnormalities (see scoring diagram/findings for  description). The left ventricular  internal cavity size was mildly dilated. There is mild left ventricular  hypertrophy. Left ventricular diastolic parameters are indeterminate.  3. Right ventricular systolic function is normal. The right ventricular  size is normal. There is moderately elevated pulmonary artery systolic  pressure.  4. Left atrial size was severely dilated.  5. Moderate pericardial effusion. Moderate pleural effusion in the left  lateral region.  6. The mitral valve is normal in structure. Trivial mitral valve  regurgitation. No evidence of mitral stenosis. Moderate mitral annular  calcification.  7. The aortic valve is tricuspid. Aortic valve regurgitation is moderate  to severe. Mild to moderate aortic valve sclerosis/calcification is  present, without any evidence of aortic stenosis.  8. The inferior vena cava is normal in size with greater than 50%  respiratory variability, suggesting right atrial pressure of 3 mmHg.   Patient Profile     86 y.o. female a hx of aortic insufficiency, HTN, CVA, DVT, pericardial effusion, dementia, CKD stage IIIa (prior Cr 1.2) who is being seen today for the evaluation of acute CHF decompensation in the setting of chronic severe aortic insufficiency and marked bradycardia  Assessment & Plan    1. Acute on chronic diastolic CHF  - likely due increased impact of severe AI in the setting of marked bradycardia - clinically improved with increased heart rates and diuresis  2. Poorly controlled HTN - previous issue as outpatient - has very broad pulse pressure, especially when bradycardic, due to severe chronic AI. May need to tolerate SBP in 140-150s to allow  DBP>50.  3. History of DVT - no evidence of active DVT at this time; course of treatment was short, maybe due to perceived risk of injury bleeding.  4. 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 I do not find any proof of true atrial fibrillation. Carvedilol stopped, HR better. She does not need a pacemaker. May reintroduce carvedilol at lower dose if appropriate.  5. Aortic insufficiency - moderate-severe by echocardiogram - not a candidate for repair.  Avoid bradycardia.  6. Pericardial effusion - known from previous, remains small-moderate, managed conservatively in the past by primary cardiologist, no evidence of tamponade at this time  7. Elevated troponin - low/flat, no angina, suspect demand process, would continue conservative approach - EF low-normal with similar wall motion abnormality as 02/2020 - suspect underlying CAD, but she does not have angina and is a poor candidate for invasive procedures.  8. Anemia, normocytic - Hgb stable, no evidence of overt bleeding  9. Dementia - very poor functional  status. Deteriorating cognitive status. I believe DNR status is appropriate and have approached her husband and children about this. Both son and daughter agree, but they are deferring to their father. He appears at a loss to reach a decision and I am not really sure that he comprehends the discussion. Palliative care consult.     For questions or updates, please contact [place] Please consult [email or web address] for contact info under        Signed, Sanda Klein, MD  08/19/2020, 12:25 PM

## 2020-08-19 NOTE — Progress Notes (Signed)
Lower extremity venous bilateral study completed.   Please see CV Proc for preliminary results.   Madelaine Whipple, RDMS  

## 2020-08-20 DIAGNOSIS — I455 Other specified heart block: Secondary | ICD-10-CM | POA: Diagnosis not present

## 2020-08-20 DIAGNOSIS — Z66 Do not resuscitate: Secondary | ICD-10-CM

## 2020-08-20 DIAGNOSIS — Z515 Encounter for palliative care: Secondary | ICD-10-CM | POA: Diagnosis not present

## 2020-08-20 DIAGNOSIS — N184 Chronic kidney disease, stage 4 (severe): Secondary | ICD-10-CM

## 2020-08-20 DIAGNOSIS — R627 Adult failure to thrive: Secondary | ICD-10-CM | POA: Diagnosis not present

## 2020-08-20 DIAGNOSIS — J9601 Acute respiratory failure with hypoxia: Secondary | ICD-10-CM | POA: Diagnosis not present

## 2020-08-20 DIAGNOSIS — I10 Essential (primary) hypertension: Secondary | ICD-10-CM | POA: Diagnosis not present

## 2020-08-20 DIAGNOSIS — I5033 Acute on chronic diastolic (congestive) heart failure: Secondary | ICD-10-CM | POA: Diagnosis not present

## 2020-08-20 DIAGNOSIS — I351 Nonrheumatic aortic (valve) insufficiency: Secondary | ICD-10-CM | POA: Diagnosis not present

## 2020-08-20 DIAGNOSIS — R001 Bradycardia, unspecified: Secondary | ICD-10-CM

## 2020-08-20 DIAGNOSIS — F0390 Unspecified dementia without behavioral disturbance: Secondary | ICD-10-CM | POA: Diagnosis not present

## 2020-08-20 DIAGNOSIS — I509 Heart failure, unspecified: Secondary | ICD-10-CM | POA: Diagnosis not present

## 2020-08-20 LAB — BASIC METABOLIC PANEL
Anion gap: 10 (ref 5–15)
BUN: 33 mg/dL — ABNORMAL HIGH (ref 8–23)
CO2: 20 mmol/L — ABNORMAL LOW (ref 22–32)
Calcium: 7.9 mg/dL — ABNORMAL LOW (ref 8.9–10.3)
Chloride: 105 mmol/L (ref 98–111)
Creatinine, Ser: 1.35 mg/dL — ABNORMAL HIGH (ref 0.44–1.00)
GFR, Estimated: 36 mL/min — ABNORMAL LOW (ref >60)
Glucose, Bld: 96 mg/dL (ref 70–99)
Potassium: 4 mmol/L (ref 3.5–5.1)
Sodium: 135 mmol/L (ref 135–145)

## 2020-08-20 MED ORDER — FUROSEMIDE 10 MG/ML IJ SOLN
40.0000 mg | Freq: Two times a day (BID) | INTRAMUSCULAR | Status: DC
  Administered 2020-08-20 (×2): 40 mg via INTRAVENOUS
  Filled 2020-08-20 (×2): qty 4

## 2020-08-20 MED ORDER — MORPHINE SULFATE 10 MG/5ML PO SOLN
5.0000 mg | ORAL | Status: DC | PRN
  Administered 2020-08-20 – 2020-08-22 (×5): 5 mg via ORAL
  Filled 2020-08-20 (×5): qty 4

## 2020-08-20 NOTE — Progress Notes (Signed)
CSW spoke with Cecille Rubin, daughter of pt, who stated pt would not be returning to [place], instead they are opting for residential hospice, although they have not chosen a hospice provider yet. Cecille Rubin stated they have a meeting with palliative care at 1 pm today, and will choose a hospice location at that time. Cecille Rubin asked questions as it related to the facilities, CSW answered. CSW will continue to follow and make the referral if/when appropriate.

## 2020-08-20 NOTE — Progress Notes (Addendum)
[facility] PROGRESS NOTE    Progress Note  Tracy Valdez  WER:[id number] DOB: 12/28/1933 DOA: 08/17/2020 PCP: Curly Rim, MD     Brief Narrative:   Tracy Valdez is an 86 y.o. female past medical history of essential hypertension, DVT no longer on anticoagulation, chronic diastolic heart failure, chronic kidney disease, advanced vascular dementia, history of right-sided stroke with left residual weakness, with ambulatory dysfunction who resides at a skilled nursing facility presents with shortness of breath and lower extremity edema found to be in acute decompensated heart failure.  Assessment/Plan:   Acute hypoxic respiratory failure secondarily to acute decompensated diastolic heart failure: He was started on IV Lasix, she had gained at least 15 pounds of fluid compared to recent evaluation. She has lower extremity edema, but I cannot appreciate any JVD. 2D echo showed an EF of 55%, with severe aortic insufficiency. We will continue IV diuresis restrict her fluids, we will keep a close eye on her blood pressure and her renal function.  There was greater than 50% variability of her inferior vena cava on 2D echo. Estimated dry weight is at or below 48 kg.  Severe aortic regurgitation: She should go home off beta-blockers,, there is no indication for anticoagulation at this point.  It is to note that she did receive a modified course of anticoagulation for her DVT as an outpatient due to her high risk of bleeding.  She is not a candidate for surgical intervention due to her cognitive decline and functional limitations.  Essential hypertension: Blood pressure seems to be relatively stable on hydralazine and IV Lasix, due to her aortic insufficiency will be conservative on her blood pressure parameters. Due to her age and aortic insufficiency her goal blood pressure will tolerate her blood pressure of less than 195 systolic.  Sinus bradycardia: Appears to be nocturnal TSH  within normal.  Second-degree AV block Mobitz type I With her current PACs, but no A. Fib.  Chronic kidney disease stage II: Creatinine appears to be at baseline.  History of DVT: She received an abbreviated course of Xarelto for DVT.  Anemia of chronic disease: Noted, currently stable.  End-of-life: Palliative care has been consulted, family is undecided whether to move towards comfort care versus palliative care.  Unstagable left hip pressure ulcer present on admission: RN Pressure Injury Documentation: Pressure Injury 08/18/20 Hip Left Unstageable - Full thickness tissue loss in which the base of the injury is covered by slough (yellow, tan, gray, green or brown) and/or eschar (tan, brown or black) in the wound bed. (Active)  08/18/20   Location: Hip  Location Orientation: Left  Staging: Unstageable - Full thickness tissue loss in which the base of the injury is covered by slough (yellow, tan, gray, green or brown) and/or eschar (tan, brown or black) in the wound bed.  Wound Description (Comments):   Present on Admission: Yes    Estimated body mass index is 21.74 kg/m as calculated from the following:   Height as of 03/21/20: 5\' 1"  (1.549 m).   Weight as of this encounter: 52.2 kg.   DVT prophylaxis: Xarelto Family Communication:none Status is: Inpatient  Remains inpatient appropriate because:Hemodynamically unstable   Dispo: The patient is from: SNF              Anticipated d/c is to: SNF              Anticipated d/c date is: > 3 days  Patient currently is not medically stable to d/c.        Code Status:     Code Status Orders  (From admission, onward)           Start     Ordered   08/17/20 1433  Full code  Continuous        08/17/20 1435           Code Status History     This patient has a current code status but no historical code status.   Advance Care Planning Activity         IV Access:   Peripheral  IV   Procedures and diagnostic studies:   VAS [place] LOWER EXTREMITY VENOUS (DVT)  Result Date: 08/19/2020  Lower Venous DVTStudy Indications: Edema.  Limitations: Poor ultrasound/tissue interface and altered mental status- unable to reposition. Comparison Study: 03-21-2020 Prior LT lower extremity venous study available. Performing Technologist: Darlin Coco  Examination Guidelines: A complete evaluation includes B-mode imaging, spectral Doppler, color Doppler, and power Doppler as needed of all accessible portions of each vessel. Bilateral testing is considered an integral part of a complete examination. Limited examinations for reoccurring indications may be performed as noted. The reflux portion of the exam is performed with the patient in reverse Trendelenburg.  +---------+---------------+---------+-----------+----------+-------------------+ RIGHT    CompressibilityPhasicitySpontaneityPropertiesThrombus Aging      +---------+---------------+---------+-----------+----------+-------------------+ CFV      Full           Yes      Yes                                      +---------+---------------+---------+-----------+----------+-------------------+ SFJ      Full                                                             +---------+---------------+---------+-----------+----------+-------------------+ FV Prox  Full                                                             +---------+---------------+---------+-----------+----------+-------------------+ FV Mid   Full                                                             +---------+---------------+---------+-----------+----------+-------------------+ FV DistalFull                                                             +---------+---------------+---------+-----------+----------+-------------------+ PFV      Full                                                              +---------+---------------+---------+-----------+----------+-------------------+  POP      Full           Yes      Yes                                      +---------+---------------+---------+-----------+----------+-------------------+ PTV      Full                                         Some segments not                                                         visualized          +---------+---------------+---------+-----------+----------+-------------------+ PERO     Full                                         Some segments not                                                         visualized          +---------+---------------+---------+-----------+----------+-------------------+   +---------+---------------+---------+-----------+----------+--------------+ LEFT     CompressibilityPhasicitySpontaneityPropertiesThrombus Aging +---------+---------------+---------+-----------+----------+--------------+ CFV      Full           Yes      Yes                                 +---------+---------------+---------+-----------+----------+--------------+ SFJ      Full                                                        +---------+---------------+---------+-----------+----------+--------------+ FV Prox  Full                                                        +---------+---------------+---------+-----------+----------+--------------+ FV Mid   Full                                                        +---------+---------------+---------+-----------+----------+--------------+ FV DistalFull                                                        +---------+---------------+---------+-----------+----------+--------------+  PFV      Full                                                        +---------+---------------+---------+-----------+----------+--------------+ POP                                                   Not visualized  +---------+---------------+---------+-----------+----------+--------------+ PTV                                                   Not visualized +---------+---------------+---------+-----------+----------+--------------+ PERO                                                  Not visualized +---------+---------------+---------+-----------+----------+--------------+     Summary: RIGHT: - There is no evidence of deep vein thrombosis in the lower extremity. However, portions of this examination were limited- see technologist comments above.  LEFT: - There is no evidence of deep vein thrombosis in the lower extremity. However, portions of this examination were limited- see technologist comments above.  *See table(s) above for measurements and observations. Electronically signed by Harold Barban MD on 08/19/2020 at 7:39:53 PM.    Final      Medical Consultants:   None.  Anti-Infectives:   None  Subjective:    Tracy Valdez she relates her breathing is about the same this morning.  Objective:    Vitals:   08/19/20 2129 08/20/20 0013 08/20/20 0421 08/20/20 0443  BP: (!) 159/58 (!) 146/66 (!) 151/43   Pulse: 68 72 65   Resp: 16 20 20    Temp: 98.4 F (36.9 C) 98.1 F (36.7 C) (!) 97.4 F (36.3 C)   TempSrc: Oral Oral Oral   SpO2: 99% 98% 95%   Weight:    52.2 kg   SpO2: 95 % O2 Flow Rate (L/min): 2 L/min   Intake/Output Summary (Last 24 hours) at 08/20/2020 0847 Last data filed at 08/20/2020 0444 Gross per 24 hour  Intake 483 ml  Output 250 ml  Net 233 ml   Filed Weights   08/19/20 0402 08/20/20 0443  Weight: 54.2 kg 52.2 kg    Exam: General exam: In no acute distress. Respiratory system: Good air movement with crackles at bases Cardiovascular system: S1 & S2 heard, RRR. No JVD. Gastrointestinal system: Abdomen is nondistended, soft and nontender.  Extremities: No pedal edema. Skin: No rashes, lesions or ulcers Psychiatry: No judgment or insight of medical  condition  Data Reviewed:    Labs: Basic Metabolic Panel: Recent Labs  Lab 08/17/20 1236 08/17/20 1236 08/18/20 0212 08/18/20 0212 08/19/20 0432 08/20/20 0259  NA 137  --  136  --  136 135  K 4.2   < > 3.6   < > 4.0 4.0  CL 106  --  105  --  103 105  CO2 21*  --  20*  --  21* 20*  GLUCOSE 139*  --  102*  --  107* 96  BUN 22  --  24*  --  29* 33*  CREATININE 1.10*  --  1.16*  --  1.36* 1.35*  CALCIUM 7.8*  --  7.5*  --  8.0* 7.9*   < > = values in this interval not displayed.   GFR Estimated Creatinine Clearance: 23.4 mL/min (A) (by C-G formula based on SCr of 1.35 mg/dL (H)). Liver Function Tests: Recent Labs  Lab 08/19/20 0432  AST 25  ALT 14  ALKPHOS 65  BILITOT 1.0  PROT 4.4*  ALBUMIN 2.1*   No results for input(s): LIPASE, AMYLASE in the last 168 hours. No results for input(s): AMMONIA in the last 168 hours. Coagulation profile No results for input(s): INR, PROTIME in the last 168 hours. COVID-19 Labs  No results for input(s): DDIMER, FERRITIN, LDH, CRP in the last 72 hours.  Lab Results  Component Value Date   [place] NEGATIVE 08/17/2020    CBC: Recent Labs  Lab 08/17/20 1236 08/19/20 0432  WBC 9.5 10.1  HGB 10.0* 10.7*  HCT 32.9* 34.1*  MCV 98.2 96.6  PLT 282 340   Cardiac Enzymes: No results for input(s): CKTOTAL, CKMB, CKMBINDEX, TROPONINI in the last 168 hours. BNP (last 3 results) No results for input(s): PROBNP in the last 8760 hours. CBG: No results for input(s): GLUCAP in the last 168 hours. D-Dimer: No results for input(s): DDIMER in the last 72 hours. Hgb A1c: No results for input(s): HGBA1C in the last 72 hours. Lipid Profile: No results for input(s): CHOL, HDL, LDLCALC, TRIG, CHOLHDL, LDLDIRECT in the last 72 hours. Thyroid function studies: Recent Labs    08/18/20 0534  TSH 2.979   Anemia work up: No results for input(s): VITAMINB12, FOLATE, FERRITIN, TIBC, IRON, RETICCTPCT in the last 72 hours. Sepsis  Labs: Recent Labs  Lab 08/17/20 1236 08/19/20 0432  WBC 9.5 10.1   Microbiology Recent Results (from the past 240 hour(s))  Respiratory Panel by RT PCR (Flu A&B, Covid) - Nasopharyngeal Swab     Status: None   Collection Time: 08/17/20 12:37 PM   Specimen: Nasopharyngeal Swab  Result Value Ref Range Status   SARS Coronavirus 2 by RT PCR NEGATIVE NEGATIVE Final    Comment: (NOTE) SARS-CoV-2 target nucleic acids are NOT DETECTED.  The SARS-CoV-2 RNA is generally detectable in upper respiratoy specimens during the acute phase of infection. The lowest concentration of SARS-CoV-2 viral copies this assay can detect is 131 copies/mL. A negative result does not preclude SARS-Cov-2 infection and should not be used as the sole basis for treatment or other patient management decisions. A negative result may occur with  improper specimen collection/handling, submission of specimen other than nasopharyngeal swab, presence of viral mutation(s) within the areas targeted by this assay, and inadequate number of viral copies (<131 copies/mL). A negative result must be combined with clinical observations, patient history, and epidemiological information. The expected result is Negative.  Fact Sheet for Patients:  PinkCheek.be  Fact Sheet for Healthcare Providers:  GravelBags.it  This test is no t yet approved or cleared by the [place] FDA and  has been authorized for detection and/or diagnosis of SARS-CoV-2 by FDA under an Emergency Use Authorization (EUA). This EUA will remain  in effect (meaning this test can be used) for the duration of the COVID-19 declaration under Section 564(b)(1) of the Act, 21 U.S.C. section 360bbb-3(b)(1), unless the authorization is terminated or revoked sooner.  Influenza A by PCR NEGATIVE NEGATIVE Final   Influenza B by PCR NEGATIVE NEGATIVE Final    Comment: (NOTE) The Xpert Xpress  SARS-CoV-2/FLU/RSV assay is intended as an aid in  the diagnosis of influenza from Nasopharyngeal swab specimens and  should not be used as a sole basis for treatment. Nasal washings and  aspirates are unacceptable for Xpert Xpress SARS-CoV-2/FLU/RSV  testing.  Fact Sheet for Patients: PinkCheek.be  Fact Sheet for Healthcare Providers: GravelBags.it  This test is not yet approved or cleared by the [place] FDA and  has been authorized for detection and/or diagnosis of SARS-CoV-2 by  FDA under an Emergency Use Authorization (EUA). This EUA will remain  in effect (meaning this test can be used) for the duration of the  Covid-19 declaration under Section 564(b)(1) of the Act, 21  U.S.C. section 360bbb-3(b)(1), unless the authorization is  terminated or revoked. Performed at [facility], [place] [address] [place], [place] [address]   MRSA PCR Screening     Status: Abnormal   Collection Time: 08/19/20  5:31 AM   Specimen: Nasopharyngeal  Result Value Ref Range Status   MRSA by PCR POSITIVE (A) NEGATIVE Final    Comment:        The GeneXpert MRSA Assay (FDA approved for NASAL specimens only), is one component of a comprehensive MRSA colonization surveillance program. It is not intended to diagnose MRSA infection nor to guide or monitor treatment for MRSA infections. RESULT CALLED TO, READ BACK BY AND VERIFIED WITH: Tenny Craw RN 10:00 08/19/20 ([email or web address]) Performed at [facility], [place] [address] [place], [place] [address]      Medications:    atorvastatin  20 mg Oral Daily   Chlorhexidine Gluconate Cloth  6 each Topical Q0600   collagenase   Topical Daily   enoxaparin (LOVENOX) injection  30 mg Subcutaneous Q24H   ferrous sulfate  325 mg Oral Q breakfast   furosemide  40 mg Intravenous Daily   hydrALAZINE  100 mg Oral TID   memantine  10 mg Oral Daily   mupirocin ointment  1 application Nasal BID     sodium chloride flush  3 mL Intravenous Q12H   vitamin B-12  100 mcg Oral Daily   Continuous Infusions:  sodium chloride        LOS: 3 days   Charlynne Cousins  [facility]  08/20/2020, 8:47 AM

## 2020-08-20 NOTE — Progress Notes (Signed)
Progress Note  Patient Name: Tracy Valdez Date of Encounter: 08/20/2020  [facility] Cardiologist: No primary care provider on file. ([facility])  Subjective   Weight down 4 lb, in/out not complete.  Inpatient Medications    Scheduled Meds: . atorvastatin  20 mg Oral Daily  . Chlorhexidine Gluconate Cloth  6 each Topical Q0600  . collagenase   Topical Daily  . enoxaparin (LOVENOX) injection  30 mg Subcutaneous Q24H  . ferrous sulfate  325 mg Oral Q breakfast  . furosemide  40 mg Intravenous Daily  . hydrALAZINE  100 mg Oral TID  . memantine  10 mg Oral Daily  . mupirocin ointment  1 application Nasal BID  . sodium chloride flush  3 mL Intravenous Q12H  . vitamin B-12  100 mcg Oral Daily   Continuous Infusions: . sodium chloride     PRN Meds: sodium chloride, acetaminophen, hydrALAZINE, ondansetron (ZOFRAN) IV, sodium chloride flush   Vital Signs    Vitals:   08/19/20 2129 08/20/20 0013 08/20/20 0421 08/20/20 0443  BP: (!) 159/58 (!) 146/66 (!) 151/43   Pulse: 68 72 65   Resp: 16 20 20    Temp: 98.4 F (36.9 C) 98.1 F (36.7 C) (!) 97.4 F (36.3 C)   TempSrc: Oral Oral Oral   SpO2: 99% 98% 95%   Weight:    52.2 kg    Intake/Output Summary (Last 24 hours) at 08/20/2020 0814 Last data filed at 08/20/2020 0444 Gross per 24 hour  Intake 483 ml  Output 250 ml  Net 233 ml   Last 3 Weights 08/20/2020 08/19/2020 03/21/2020  Weight (lbs) 115 lb 1.3 oz 119 lb 7.8 oz 107 lb  Weight (kg) 52.2 kg 54.2 kg 48.535 kg      Telemetry    SR w PACs, a single brief PAT, no AFib, no pauses/brady - Personally Reviewed  ECG    No new tracing - Personally Reviewed  Physical Exam  Breathing quietly at [facility] 20 deg GEN: No acute distress.   Neck: 6-7 cm JVD Cardiac: RRR w occ ectopy, no murmurs, rubs, or gallops.  Respiratory: Clear to auscultation bilaterally. GI: Soft, nontender, non-distended  MS: No edema; No deformity. Neuro:  Nonfocal  Psych: Normal  affect   Labs    High Sensitivity Troponin:   Recent Labs  Lab 08/17/20 1236 08/17/20 1257 08/18/20 0212  TROPONINIHS 83* 93* 84*      Chemistry Recent Labs  Lab 08/18/20 0212 08/19/20 0432 08/20/20 0259  NA 136 136 135  K 3.6 4.0 4.0  CL 105 103 105  CO2 20* 21* 20*  GLUCOSE 102* 107* 96  BUN 24* 29* 33*  CREATININE 1.16* 1.36* 1.35*  CALCIUM 7.5* 8.0* 7.9*  PROT  --  4.4*  --   ALBUMIN  --  2.1*  --   AST  --  25  --   ALT  --  14  --   ALKPHOS  --  65  --   BILITOT  --  1.0  --   GFRNONAA 43* 36* 36*  ANIONGAP 11 12 10      Hematology Recent Labs  Lab 08/17/20 1236 08/19/20 0432  WBC 9.5 10.1  RBC 3.35* 3.53*  HGB 10.0* 10.7*  HCT 32.9* 34.1*  MCV 98.2 96.6  MCH 29.9 30.3  MCHC 30.4 31.4  RDW 13.4 13.6  PLT 282 340    BNP Recent Labs  Lab 08/17/20 1236  BNP 3,923.3*     DDimer  No results for input(s): DDIMER in the last 168 hours.   Radiology    VAS [place] LOWER EXTREMITY VENOUS (DVT)  Result Date: 08/19/2020  Lower Venous DVTStudy Indications: Edema.  Limitations: Poor ultrasound/tissue interface and altered mental status- unable to reposition. Comparison Study: 03-21-2020 Prior LT lower extremity venous study available. Performing Technologist: Darlin Coco  Examination Guidelines: A complete evaluation includes B-mode imaging, spectral Doppler, color Doppler, and power Doppler as needed of all accessible portions of each vessel. Bilateral testing is considered an integral part of a complete examination. Limited examinations for reoccurring indications may be performed as noted. The reflux portion of the exam is performed with the patient in reverse Trendelenburg.  +---------+---------------+---------+-----------+----------+-------------------+ RIGHT    CompressibilityPhasicitySpontaneityPropertiesThrombus Aging      +---------+---------------+---------+-----------+----------+-------------------+ CFV      Full           Yes      Yes                                       +---------+---------------+---------+-----------+----------+-------------------+ SFJ      Full                                                             +---------+---------------+---------+-----------+----------+-------------------+ FV Prox  Full                                                             +---------+---------------+---------+-----------+----------+-------------------+ FV Mid   Full                                                             +---------+---------------+---------+-----------+----------+-------------------+ FV DistalFull                                                             +---------+---------------+---------+-----------+----------+-------------------+ PFV      Full                                                             +---------+---------------+---------+-----------+----------+-------------------+ POP      Full           Yes      Yes                                      +---------+---------------+---------+-----------+----------+-------------------+ PTV      Full  Some segments not                                                         visualized          +---------+---------------+---------+-----------+----------+-------------------+ PERO     Full                                         Some segments not                                                         visualized          +---------+---------------+---------+-----------+----------+-------------------+   +---------+---------------+---------+-----------+----------+--------------+ LEFT     CompressibilityPhasicitySpontaneityPropertiesThrombus Aging +---------+---------------+---------+-----------+----------+--------------+ CFV      Full           Yes      Yes                                  +---------+---------------+---------+-----------+----------+--------------+ SFJ      Full                                                        +---------+---------------+---------+-----------+----------+--------------+ FV Prox  Full                                                        +---------+---------------+---------+-----------+----------+--------------+ FV Mid   Full                                                        +---------+---------------+---------+-----------+----------+--------------+ FV DistalFull                                                        +---------+---------------+---------+-----------+----------+--------------+ PFV      Full                                                        +---------+---------------+---------+-----------+----------+--------------+ POP  Not visualized +---------+---------------+---------+-----------+----------+--------------+ PTV                                                   Not visualized +---------+---------------+---------+-----------+----------+--------------+ PERO                                                  Not visualized +---------+---------------+---------+-----------+----------+--------------+     Summary: RIGHT: - There is no evidence of deep vein thrombosis in the lower extremity. However, portions of this examination were limited- see technologist comments above.  LEFT: - There is no evidence of deep vein thrombosis in the lower extremity. However, portions of this examination were limited- see technologist comments above.  *See table(s) above for measurements and observations. Electronically signed by Harold Barban MD on 08/19/2020 at 7:39:53 PM.    Final     Cardiac Studies   2D echo 08/17/20  1. Akinesis of the basal inferolateral wall with overall low normal LV  function; mild LVH; mild LVE; calcified aortic valve with  moderate to  severe AI; severe LAE; small to moderate pericardial effusion.  2. Left ventricular ejection fraction, by estimation, is 50 to 55%. The  left ventricle has low normal function. The left ventricle demonstrates  regional wall motion abnormalities (see scoring diagram/findings for  description). The left ventricular  internal cavity size was mildly dilated. There is mild left ventricular  hypertrophy. Left ventricular diastolic parameters are indeterminate.  3. Right ventricular systolic function is normal. The right ventricular  size is normal. There is moderately elevated pulmonary artery systolic  pressure.  4. Left atrial size was severely dilated.  5. Moderate pericardial effusion. Moderate pleural effusion in the left  lateral region.  6. The mitral valve is normal in structure. Trivial mitral valve  regurgitation. No evidence of mitral stenosis. Moderate mitral annular  calcification.  7. The aortic valve is tricuspid. Aortic valve regurgitation is moderate  to severe. Mild to moderate aortic valve sclerosis/calcification is  present, without any evidence of aortic stenosis.  8. The inferior vena cava is normal in size with greater than 50%  respiratory variability, suggesting right atrial pressure of 3 mmHg.    Patient Profile     86 y.o. female with hx of aortic insufficiency, HTN, CVA, DVT, pericardial effusion, dementia, CKD stage IIIa (prior Cr 1.2)who is being seen today for the evaluation ofacute CHF decompensation in the setting of chronic severe aortic insufficiency and marked bradycardia  Assessment & Plan    1. Acute on chronic diastolic CHF - likely due increased impact of severe AI in the setting of marked bradycardia - improving - target dry weight <107 lb. Diuresing slowly due to CKD.  2. Poorly controlled HTN - previous issue as outpatient. Need to allow mild elevation in SBP. Acceptable last 24 h. - has very broad pulse pressure,  especially when bradycardic, due to severe chronic AI. Tolerate SBP in 140-150s to allow DBP>50.  3. History of DVT - no evidence of active DVT at this time; course of treatment was short, maybe due to perceived risk of injury bleeding.  4. Sinoatrial block, 2nd deg MT1 - continues to have PACs, but there is no further AV block or AFib. One  very short burst of ectopic ATach.  5. Aortic insufficiency - moderate-severe by echocardiogram - not a candidate for repair.  Avoid bradycardia.  6. Pericardial effusion - known from previous, remains small-moderate, managed conservatively in the past by primary cardiologist, no evidence of tamponade at this time  7.Elevated troponin - low/flat, no angina, suspect demand process, would continue conservative approach - EF low-normal with similar wall motion abnormalityas5/2021 - suspect underlying CAD, but she does not have angina and is a poor candidate for invasive procedures.  8. Anemia, normocytic - Hgb stable, no evidence of overt bleeding  9. Dementia - very poor functional status. Deteriorating cognitive status. I believe DNR status is appropriate and have approached her husband and children about this. Both son and daughter agree, but they are deferring to their father. He appears at a loss to reach a decision and I am not really sure that he comprehends the discussion. Palliative care consult.  10. CKD 4: baseline creat 1.25 -1.3 based on CareEverywhere labs, w only one exception in April 2021 at 1.08.      For questions or updates, please contact [place] Please consult [email or web address] for contact info under        Signed, Sanda Klein, MD  08/20/2020, 8:14 AM

## 2020-08-20 NOTE — Consult Note (Signed)
Consultation Note Date: 08/20/2020   Patient Name: Tracy Valdez  DOB: [birth year]  MRN: [id number]  Age / Sex: 86 y.o., female  PCP: Corrington, Delsa Grana, MD Referring Physician: Aileen Fass, Tammi Klippel, MD  Reason for Consultation: Establishing goals of care and Psychosocial/spiritual support  HPI/Patient Profile: 86 y.o. female   admitted on 08/17/2020 with PMH of dementia, aortic insufficiency with last echo 03/16/20 showing EF 60%, mildly dilated LV,hypertension,  DVT off anticoagulation, CKD stage II, stroke with left-sided weakness and chronic ambulation dysfunction,  presented with increasing short of breath and leg swelling for 1 month.    Patient is a nursing home resident for last 4+ months for worsening of ambulation function and has been wheelchair-bound since summer this year.  Currently she is living in memory care at [facility].   Patient has had continued physical, functional and cognitive decline over the past 8 months.  Multiple falls. Decreased po intake.  Family face treatment option decisions, advanced directive decisions and anticipatory care needs.   Clinical Assessment and Goals of Care:   This NP Wadie Lessen reviewed medical records, received report from team, assessed the patient and then meet at the patient's bedside along with her husband, son and daughter  to discuss diagnosis, prognosis, GOC, EOL wishes disposition and options.   Concept of Palliative Care was introduced as specialized medical care for people and their families living with serious illness.  If focuses on providing relief from the symptoms and stress of a serious illness.  The goal is to improve quality of life for both the patient and the family.  Values and goals of care important to patient and family were attempted to be elicited.  Created space and opportunity for family to explore her thoughts and feelings  regarding patient's current medical situation.  All family members present verbalize their acknowledgment of the patient's ongoing decline all are in agreement that the focus of care should be comfort and dignity moving forward.   A  discussion was had today regarding advanced directives.  Concepts specific to code status, artifical feeding and hydration, continued IV antibiotics and rehospitalization was had.    The difference between a aggressive medical intervention path  and a palliative comfort care path for this patient at this time was had.    Education offered on hospice benefit and eligibility criteria.  We discussed hospice benefit both in the home, at a  AL/SNF versus residential hospice.  MOST form  reviewed and completed.  Hard copy placed in chart, copy was given to the family and a copy is sent to medical records for scanning.   Natural trajectory and expectations at EOL were discussed.  Questions and concerns addressed.  Patient  encouraged to call with questions or concerns.     PMT will continue to support holistically.      No advance care planning documents or healthcare power of attorney documents per family.  However patient's husband is next of kin and main decision maker supported by both of his children  who all work in unison for the best interest of this patient.    SUMMARY OF RECOMMENDATIONS    Code Status/Advance Care Planning:  DNR-documented today   Palliative Prophylaxis:   Aspiration, Bowel Regimen, Frequent Pain Assessment and Oral Care  Additional Recommendations (Limitations, Scope, Preferences):  Avoid Hospitalization, Full Comfort Care, Minimize Medications, Initiate Comfort Feeding, No Artificial Feeding, No Blood Transfusions, No Diagnostics, No Glucose Monitoring, No IV Antibiotics, No IV Fluids and No Lab Draws  Psycho-social/Spiritual:   Desire for further Chaplaincy support:no  Additional Recommendations: Education on  Hospice  Prognosis:   < 3 months  Discharge Planning: Family is open to hospice services.  They are now in the process of exploring options of return to [facility] assisted living/memory unit with hospice benefits in place.  Discussed with TOC team and Dr Venetia Constable      Primary Diagnoses: Present on Admission: **None**   I have reviewed the medical record, interviewed the patient and family, and examined the patient. The following aspects are pertinent.  Past Medical History:  Diagnosis Date  . Aortic insufficiency   . Chronic kidney disease, stage 3a ([place])   . Dementia ([place])   . DVT (deep venous thrombosis) ([place])   . Hypertension   . Pericardial effusion   . Stroke [facility])    Social History   Socioeconomic History  . Marital status: Married    Spouse name: Not on file  . Number of children: Not on file  . Years of education: Not on file  . Highest education level: Not on file  Occupational History  . Not on file  Tobacco Use  . Smoking status: Never Smoker  . Smokeless tobacco: Never Used  Vaping Use  . Vaping Use: Never used  Substance and Sexual Activity  . Alcohol use: Never  . Drug use: Never  . Sexual activity: Not on file  Other Topics Concern  . Not on file  Social History Narrative  . Not on file   Social Determinants of Health   Financial Resource Strain:   . Difficulty of Paying Living Expenses: Not on file  Food Insecurity:   . Worried About Charity fundraiser in the Last Year: Not on file  . Ran Out of Food in the Last Year: Not on file  Transportation Needs:   . Lack of Transportation (Medical): Not on file  . Lack of Transportation (Non-Medical): Not on file  Physical Activity:   . Days of Exercise per Week: Not on file  . Minutes of Exercise per Session: Not on file  Stress:   . Feeling of Stress : Not on file  Social Connections:   . Frequency of Communication with Friends and Family: Not on file  . Frequency of Social Gatherings with  Friends and Family: Not on file  . Attends Religious Services: Not on file  . Active Member of Clubs or Organizations: Not on file  . Attends Archivist Meetings: Not on file  . Marital Status: Not on file   Family History  Family history unknown: Yes   Scheduled Meds: . atorvastatin  20 mg Oral Daily  . Chlorhexidine Gluconate Cloth  6 each Topical Q0600  . collagenase   Topical Daily  . enoxaparin (LOVENOX) injection  30 mg Subcutaneous Q24H  . ferrous sulfate  325 mg Oral Q breakfast  . furosemide  40 mg Intravenous Q12H  . hydrALAZINE  100 mg Oral TID  . memantine  10 mg Oral Daily  .  mupirocin ointment  1 application Nasal BID  . sodium chloride flush  3 mL Intravenous Q12H  . vitamin B-12  100 mcg Oral Daily   Continuous Infusions: . sodium chloride     PRN Meds:.sodium chloride, acetaminophen, hydrALAZINE, ondansetron (ZOFRAN) IV, sodium chloride flush Medications Prior to Admission:  Prior to Admission medications   Medication Sig Start Date End Date Taking? Authorizing Provider  acetaminophen (TYLENOL) 500 MG tablet Take 1,000 mg by mouth every 8 (eight) hours as needed for moderate pain.   Yes [provider]  amoxicillin-clavulanate (AUGMENTIN) 875-125 MG tablet Take 1 tablet by mouth 2 (two) times daily.   Yes [provider]  atorvastatin (LIPITOR) 20 MG tablet Take 20 mg by mouth daily. 08/15/19  Yes [provider]  carvedilol (COREG) 12.5 MG tablet Take 12.5 mg by mouth 2 (two) times daily. 06/14/19  Yes [provider]  ferrous sulfate 325 (65 FE) MG tablet Take 325 mg by mouth daily with breakfast.   Yes [provider]  furosemide (LASIX) 20 MG tablet Take 20 mg by mouth daily. 07/18/19  Yes [provider]  hydrALAZINE (APRESOLINE) 100 MG tablet Take 100 mg by mouth 3 (three) times daily.   Yes [provider]  memantine (NAMENDA) 10 MG tablet Take 10 mg by mouth daily.  07/17/19  Yes  [provider]  vitamin B-12 (CYANOCOBALAMIN) 100 MCG tablet Take 100 mcg by mouth daily.   Yes [provider]   No Known Allergies Review of Systems  Unable to perform ROS: Dementia    Physical Exam Constitutional:      Appearance: She is underweight. She is ill-appearing.  Cardiovascular:     Rate and Rhythm: Normal rate.  Musculoskeletal:     Comments: generalized weakness and muscle arophy  Skin:    General: Skin is warm and dry.  Neurological:     Mental Status: She is lethargic.     Vital Signs: BP (!) 151/43 (BP Location: Right Arm)   Pulse 65   Temp (!) 97.4 F (36.3 C) (Oral)   Resp 20   Wt 52.2 kg   SpO2 95%   BMI 21.74 kg/m  Pain Scale: 0-10   Pain Score: Asleep   SpO2: SpO2: 95 % O2 Device:SpO2: 95 % O2 Flow Rate: .O2 Flow Rate (L/min): 2 L/min  IO: Intake/output summary:   Intake/Output Summary (Last 24 hours) at 08/20/2020 1011 Last data filed at 08/20/2020 0851 Gross per 24 hour  Intake 543 ml  Output 250 ml  Net 293 ml    LBM: Last BM Date: 08/19/20 Baseline Weight: Weight: 54.2 kg Most recent weight: Weight: 52.2 kg     Palliative Assessment/Data:  30 % at best   Discussed with Dr Venetia Constable and [facility] team   Time In: 1350 Time Out: 1500 Time Total: 70 minutes Greater than 50%  of this time was spent counseling and coordinating care related to the above assessment and plan.  Signed by: Wadie Lessen, NP   Please contact Palliative Medicine Team phone at [phone] for questions and concerns.  For individual provider: See Shea Evans

## 2020-08-21 DIAGNOSIS — I5031 Acute diastolic (congestive) heart failure: Secondary | ICD-10-CM | POA: Diagnosis not present

## 2020-08-21 DIAGNOSIS — F0390 Unspecified dementia without behavioral disturbance: Secondary | ICD-10-CM | POA: Diagnosis not present

## 2020-08-21 DIAGNOSIS — J9601 Acute respiratory failure with hypoxia: Secondary | ICD-10-CM | POA: Diagnosis not present

## 2020-08-21 DIAGNOSIS — I351 Nonrheumatic aortic (valve) insufficiency: Secondary | ICD-10-CM | POA: Diagnosis not present

## 2020-08-21 DIAGNOSIS — R001 Bradycardia, unspecified: Secondary | ICD-10-CM

## 2020-08-21 MED ORDER — MORPHINE SULFATE 10 MG/5ML PO SOLN
5.0000 mg | ORAL | 0 refills | Status: AC | PRN
Start: 2020-08-21 — End: ?

## 2020-08-21 MED ORDER — GUAIFENESIN-DM 100-10 MG/5ML PO SYRP: 10.0000 mL | ORAL_SOLUTION | Freq: Once | ORAL | Status: DC

## 2020-08-21 NOTE — Discharge Summary (Addendum)
Physician Discharge Summary  Tracy Valdez HQI:[id number] DOB: April 02, 1934 DOA: 08/17/2020  PCP: Curly Rim, MD  Admit date: 08/17/2020 Discharge date: 08/21/2020  Admitted From: ALF Disposition: Residential hospice facility.    Recommendations for Outpatient Follow-up:  1. Patient will need to go to residential hospice her life expectancy is less than 2 weeks she is eating less than 30% of her meals and we have stopped her medications as her family decided to move towards comfort care.   Home Health:No Equipment/Devices:none  Discharge Condition:Hospice CODE STATUS:Full Diet recommendation: Heart Healthy    Brief/Interim Summary:  86 y.o. female past medical history of essential hypertension, DVT no longer on anticoagulation, chronic diastolic heart failure, chronic kidney disease, advanced vascular dementia, history of right-sided stroke with left residual weakness, with ambulatory dysfunction who resides at a skilled nursing facility presents with shortness of breath and lower extremity edema found to be in acute decompensated heart failure.  Discharge Diagnoses:  Active Problems:   Acute diastolic CHF (congestive heart failure) (HCC)   Nonrheumatic aortic (valve) insufficiency   SA block   Dementia without behavioral disturbance (HCC)   Essential hypertension   Sinus bradycardia   Adult failure to thrive   DNR (do not resuscitate)   Palliative care by specialist  Acute respiratory failure with hypoxia secondary to acute decompensated diastolic heart failure: In the setting of aortic regurgitation, she was started on IV Lasix and she has gained 15 pounds of fluid compared to her recent evaluation. Cardiology was consulted and a formal 2D echo that showed a severe aortic regurgitation. After minimal diuresis cardiology recommended to move to comfort care, palliative care was consulted and the family agreed.   Family decided to send the patient to residential hospice and  full comfort.  We have stopped all over medications for heart failure including her Lasix and beta-blockers, she is eating less than 30% of her meals her life expectancy is less than 2 weeks, so she would be a good candidate for residential hospice facility.  Severe aortic regurgitation: She was discharged home off beta-blockers, after having a meeting with the cardiologist imperative care the family decided to move towards comfort care: Essential hypertension: All her antihypertensive medications were DC'd.  Sinus bradycardia degree AV block type I: No A. fib she was discharged on no beta-blocker she is not a candidate for anticoagulation.  Chronic kidney disease stage II: Her creatinine returned to baseline.  History of DVT: On admission she was not on Xarelto.  Anemia of chronic disease: Noted.  End-of-life/goals of care: Plan of care met with family and they decided to move towards comfort care they have decided to send her to a residential hospice facility.  Unstageable left hip pressure ulcer present on admission:  Discharge Instructions  Discharge Instructions    Diet - low sodium heart healthy   Complete by: As directed    Increase activity slowly   Complete by: As directed    No wound care   Complete by: As directed      Allergies as of 08/21/2020   No Known Allergies     Medication List    STOP taking these medications   acetaminophen 500 MG tablet Commonly known as: TYLENOL   amoxicillin-clavulanate 875-125 MG tablet Commonly known as: AUGMENTIN   atorvastatin 20 MG tablet Commonly known as: LIPITOR   carvedilol 12.5 MG tablet Commonly known as: COREG   ferrous sulfate 325 (65 FE) MG tablet   furosemide 20 MG tablet Commonly  known as: LASIX   hydrALAZINE 100 MG tablet Commonly known as: APRESOLINE   memantine 10 MG tablet Commonly known as: NAMENDA   vitamin B-12 100 MCG tablet Commonly known as: CYANOCOBALAMIN     TAKE these medications    morphine 10 MG/5ML solution Take 2.5 mLs (5 mg total) by mouth every 3 (three) hours as needed.       No Known Allergies  Consultations:  Cardiology  Palliative care   Procedures/Studies: DG Chest 2 View  Result Date: 08/17/2020 CLINICAL DATA:  Status post fall. EXAM: CHEST - 2 VIEW COMPARISON:  August 18, 2019. FINDINGS: Stable cardiomegaly. No pneumothorax is noted. Mild left pleural effusion is noted with probable associated left basilar atelectasis or infiltrate. Central pulmonary vascular congestion is noted with possible bilateral perihilar edema. Bony thorax is unremarkable. IMPRESSION: Mild left pleural effusion with probable associated left basilar atelectasis or infiltrate. Central pulmonary vascular congestion with possible bilateral perihilar edema. Aortic Atherosclerosis (ICD10-I70.0). Electronically Signed   By: Marijo Conception M.D.   On: 08/17/2020 12:19   DG Pelvis 1-2 Views  Result Date: 08/17/2020 CLINICAL DATA:  Fall. EXAM: PELVIS - 1-2 VIEW COMPARISON:  August 18, 2019. FINDINGS: There is no evidence of pelvic fracture or diastasis. No pelvic bone lesions are seen. IMPRESSION: Negative. Electronically Signed   By: Marijo Conception M.D.   On: 08/17/2020 12:21   DG Chest Port 1 View  Result Date: 08/18/2020 CLINICAL DATA:  Congestive failure EXAM: PORTABLE CHEST 1 VIEW COMPARISON:  08/17/2020 FINDINGS: Cardiac shadow is enlarged in size. Aortic calcifications are noted. Increasing vascular congestion is noted with mild interstitial edema. Bilateral pleural effusions are noted left greater than right. No bony abnormality is seen. IMPRESSION: CHF with bilateral effusions left greater than right. The overall appearance is stable from the prior exam. Electronically Signed   By: Inez Catalina M.D.   On: 08/18/2020 15:12   ECHOCARDIOGRAM COMPLETE  Result Date: 08/17/2020    ECHOCARDIOGRAM REPORT   Patient Name:   Tracy Valdez Date of Exam: 08/17/2020 Medical Rec #:   [id number]     Height:       61.0 in Accession #:    [id number]    Weight:       107.0 lb Date of Birth:  August 15, 1934      BSA:          1.448 m Patient Age:    38 years      BP:           195/26 mmHg Patient Gender: F             HR:           79 bpm. Exam Location:  Inpatient Procedure: 2D Echo Indications:    acute diastolic CHF 673.41  History:        Patient has no prior history of Echocardiogram examinations.  Sonographer:    Johny Chess Referring Phys: [id number] [place]  1. Akinesis of the basal inferolateral wall with overall low normal LV function; mild LVH; mild LVE; calcified aortic valve with moderate to severe AI; severe LAE; small to moderate pericardial effusion.  2. Left ventricular ejection fraction, by estimation, is 50 to 55%. The left ventricle has low normal function. The left ventricle demonstrates regional wall motion abnormalities (see scoring diagram/findings for description). The left ventricular internal cavity size was mildly dilated. There is mild left ventricular hypertrophy. Left ventricular diastolic parameters are indeterminate.  3.  Right ventricular systolic function is normal. The right ventricular size is normal. There is moderately elevated pulmonary artery systolic pressure.  4. Left atrial size was severely dilated.  5. Moderate pericardial effusion. Moderate pleural effusion in the left lateral region.  6. The mitral valve is normal in structure. Trivial mitral valve regurgitation. No evidence of mitral stenosis. Moderate mitral annular calcification.  7. The aortic valve is tricuspid. Aortic valve regurgitation is moderate to severe. Mild to moderate aortic valve sclerosis/calcification is present, without any evidence of aortic stenosis.  8. The inferior vena cava is normal in size with greater than 50% respiratory variability, suggesting right atrial pressure of 3 mmHg. FINDINGS  Left Ventricle: Left ventricular ejection fraction, by estimation, is 50 to  55%. The left ventricle has low normal function. The left ventricle demonstrates regional wall motion abnormalities. The left ventricular internal cavity size was mildly dilated. There is mild left ventricular hypertrophy. Left ventricular diastolic parameters are indeterminate. Right Ventricle: The right ventricular size is normal.Right ventricular systolic function is normal. There is moderately elevated pulmonary artery systolic pressure. The tricuspid regurgitant velocity is 3.38 m/s, and with an assumed right atrial pressure of 3 mmHg, the estimated right ventricular systolic pressure is 59.9 mmHg. Left Atrium: Left atrial size was severely dilated. Right Atrium: Right atrial size was normal in size. Pericardium: A moderately sized pericardial effusion is present. Mitral Valve: The mitral valve is normal in structure. Moderate mitral annular calcification. Trivial mitral valve regurgitation. No evidence of mitral valve stenosis. Tricuspid Valve: The tricuspid valve is normal in structure. Tricuspid valve regurgitation is mild . No evidence of tricuspid stenosis. Aortic Valve: The aortic valve is tricuspid. Aortic valve regurgitation is moderate to severe. Mild to moderate aortic valve sclerosis/calcification is present, without any evidence of aortic stenosis. Pulmonic Valve: The pulmonic valve was not well visualized. Pulmonic valve regurgitation is mild. No evidence of pulmonic stenosis. Aorta: The aortic root is normal in size and structure. Venous: The inferior vena cava is normal in size with greater than 50% respiratory variability, suggesting right atrial pressure of 3 mmHg. IAS/Shunts: No atrial level shunt detected by color flow Doppler. Additional Comments: Akinesis of the basal inferolateral wall with overall low normal LV function; mild LVH; mild LVE; calcified aortic valve with moderate to severe AI; severe LAE; small to moderate pericardial effusion. There is a moderate pleural effusion in the  left lateral region.  LEFT VENTRICLE PLAX 2D LVIDd:         5.40 cm LVIDs:         4.30 cm LV PW:         0.90 cm LV IVS:        1.10 cm LVOT diam:     1.90 cm LVOT Area:     2.84 cm  RIGHT VENTRICLE             IVC RV S prime:     13.40 cm/s  IVC diam: 1.60 cm LEFT ATRIUM             Index       RIGHT ATRIUM           Index LA diam:        4.30 cm 2.97 cm/m  RA Area:     13.20 cm LA Vol (A2C):   73.4 ml 50.70 ml/m RA Volume:   29.30 ml  20.24 ml/m LA Vol (A4C):   74.1 ml 51.18 ml/m LA Biplane Vol: 74.1 ml 51.18 ml/m  AORTA Ao Root diam: 3.30 cm Ao Asc diam:  3.30 cm TRICUSPID VALVE TR Peak grad:   45.7 mmHg TR Vmax:        338.00 cm/s  SHUNTS Systemic Diam: 1.90 cm Kirk Ruths MD Electronically signed by Kirk Ruths MD Signature Date/Time: 08/17/2020/5:14:12 PM    Final    DG FEMUR, MIN 2 VIEWS RIGHT  Result Date: 08/17/2020 CLINICAL DATA:  Fall. EXAM: RIGHT FEMUR 2 VIEWS COMPARISON:  None. FINDINGS: There is no evidence of fracture or other focal bone lesions. Soft tissues are unremarkable. IMPRESSION: Negative. Electronically Signed   By: Marijo Conception M.D.   On: 08/17/2020 12:22   VAS [place] LOWER EXTREMITY VENOUS (DVT)  Result Date: 08/19/2020  Lower Venous DVTStudy Indications: Edema.  Limitations: Poor ultrasound/tissue interface and altered mental status- unable to reposition. Comparison Study: 03-21-2020 Prior LT lower extremity venous study available. Performing Technologist: Darlin Coco  Examination Guidelines: A complete evaluation includes B-mode imaging, spectral Doppler, color Doppler, and power Doppler as needed of all accessible portions of each vessel. Bilateral testing is considered an integral part of a complete examination. Limited examinations for reoccurring indications may be performed as noted. The reflux portion of the exam is performed with the patient in reverse Trendelenburg.  +---------+---------------+---------+-----------+----------+-------------------+ RIGHT     CompressibilityPhasicitySpontaneityPropertiesThrombus Aging      +---------+---------------+---------+-----------+----------+-------------------+ CFV      Full           Yes      Yes                                      +---------+---------------+---------+-----------+----------+-------------------+ SFJ      Full                                                             +---------+---------------+---------+-----------+----------+-------------------+ FV Prox  Full                                                             +---------+---------------+---------+-----------+----------+-------------------+ FV Mid   Full                                                             +---------+---------------+---------+-----------+----------+-------------------+ FV DistalFull                                                             +---------+---------------+---------+-----------+----------+-------------------+ PFV      Full                                                             +---------+---------------+---------+-----------+----------+-------------------+  POP      Full           Yes      Yes                                      +---------+---------------+---------+-----------+----------+-------------------+ PTV      Full                                         Some segments not                                                         visualized          +---------+---------------+---------+-----------+----------+-------------------+ PERO     Full                                         Some segments not                                                         visualized          +---------+---------------+---------+-----------+----------+-------------------+   +---------+---------------+---------+-----------+----------+--------------+ LEFT     CompressibilityPhasicitySpontaneityPropertiesThrombus Aging  +---------+---------------+---------+-----------+----------+--------------+ CFV      Full           Yes      Yes                                 +---------+---------------+---------+-----------+----------+--------------+ SFJ      Full                                                        +---------+---------------+---------+-----------+----------+--------------+ FV Prox  Full                                                        +---------+---------------+---------+-----------+----------+--------------+ FV Mid   Full                                                        +---------+---------------+---------+-----------+----------+--------------+ FV DistalFull                                                        +---------+---------------+---------+-----------+----------+--------------+  PFV      Full                                                        +---------+---------------+---------+-----------+----------+--------------+ POP                                                   Not visualized +---------+---------------+---------+-----------+----------+--------------+ PTV                                                   Not visualized +---------+---------------+---------+-----------+----------+--------------+ PERO                                                  Not visualized +---------+---------------+---------+-----------+----------+--------------+     Summary: RIGHT: - There is no evidence of deep vein thrombosis in the lower extremity. However, portions of this examination were limited- see technologist comments above.  LEFT: - There is no evidence of deep vein thrombosis in the lower extremity. However, portions of this examination were limited- see technologist comments above.  *See table(s) above for measurements and observations. Electronically signed by Harold Barban MD on 08/19/2020 at 7:39:53 PM.    Final       Subjective: Somnolent,  She is still significantly confused.  Discharge Exam: Vitals:   08/20/20 1144 08/20/20 2052  BP: (!) 157/32 (!) 181/48  Pulse: 65 70  Resp: 18 18  Temp: 97.7 F (36.5 C) 97.9 F (36.6 C)  SpO2: 95% 93%   Vitals:   08/20/20 1034 08/20/20 1035 08/20/20 1144 08/20/20 2052  BP: (!) 183/47  (!) 157/32 (!) 181/48  Pulse: 67  65 70  Resp:   18 18  Temp:   97.7 F (36.5 C) 97.9 F (36.6 C)  TempSrc:   Axillary Oral  SpO2: 96% 95% 95% 93%  Weight:        General: Pt is alert, awake, not in acute distress Cardiovascular: RRR, S1/S2 +, no rubs, no gallops Respiratory: CTA bilaterally, no wheezing, no rhonchi Abdominal: Soft, NT, ND, bowel sounds + Extremities: no edema, no cyanosis    The results of significant diagnostics from this hospitalization (including imaging, microbiology, ancillary and laboratory) are listed below for reference.     Microbiology: Recent Results (from the past 240 hour(s))  Respiratory Panel by RT PCR (Flu A&B, Covid) - Nasopharyngeal Swab     Status: None   Collection Time: 08/17/20 12:37 PM   Specimen: Nasopharyngeal Swab  Result Value Ref Range Status   SARS Coronavirus 2 by RT PCR NEGATIVE NEGATIVE Final    Comment: (NOTE) SARS-CoV-2 target nucleic acids are NOT DETECTED.  The SARS-CoV-2 RNA is generally detectable in upper respiratoy specimens during the acute phase of infection. The lowest concentration of SARS-CoV-2 viral copies this assay can detect is 131 copies/mL. A negative result does not preclude SARS-Cov-2 infection and should not be used as the sole basis for  treatment or other patient management decisions. A negative result may occur with  improper specimen collection/handling, submission of specimen other than nasopharyngeal swab, presence of viral mutation(s) within the areas targeted by this assay, and inadequate number of viral copies (<131 copies/mL). A negative result must be combined with  clinical observations, patient history, and epidemiological information. The expected result is Negative.  Fact Sheet for Patients:  PinkCheek.be  Fact Sheet for Healthcare Providers:  GravelBags.it  This test is no t yet approved or cleared by the [place] FDA and  has been authorized for detection and/or diagnosis of SARS-CoV-2 by FDA under an Emergency Use Authorization (EUA). This EUA will remain  in effect (meaning this test can be used) for the duration of the COVID-19 declaration under Section 564(b)(1) of the Act, 21 U.S.C. section 360bbb-3(b)(1), unless the authorization is terminated or revoked sooner.     Influenza A by PCR NEGATIVE NEGATIVE Final   Influenza B by PCR NEGATIVE NEGATIVE Final    Comment: (NOTE) The Xpert Xpress SARS-CoV-2/FLU/RSV assay is intended as an aid in  the diagnosis of influenza from Nasopharyngeal swab specimens and  should not be used as a sole basis for treatment. Nasal washings and  aspirates are unacceptable for Xpert Xpress SARS-CoV-2/FLU/RSV  testing.  Fact Sheet for Patients: PinkCheek.be  Fact Sheet for Healthcare Providers: GravelBags.it  This test is not yet approved or cleared by the [place] FDA and  has been authorized for detection and/or diagnosis of SARS-CoV-2 by  FDA under an Emergency Use Authorization (EUA). This EUA will remain  in effect (meaning this test can be used) for the duration of the  Covid-19 declaration under Section 564(b)(1) of the Act, 21  U.S.C. section 360bbb-3(b)(1), unless the authorization is  terminated or revoked. Performed at [facility], [place] [address], [place], [place] [address]   MRSA PCR Screening     Status: Abnormal   Collection Time: 08/19/20  5:31 AM   Specimen: Nasopharyngeal  Result Value Ref Range Status   MRSA by PCR POSITIVE (A) NEGATIVE Final     Comment:        The GeneXpert MRSA Assay (FDA approved for NASAL specimens only), is one component of a comprehensive MRSA colonization surveillance program. It is not intended to diagnose MRSA infection nor to guide or monitor treatment for MRSA infections. RESULT CALLED TO, READ BACK BY AND VERIFIED WITH: Tenny Craw RN 10:00 08/19/20 ([email or web address]) Performed at [facility], [place] [address], [place], [place] [address]      Labs: BNP (last 3 results) Recent Labs    08/17/20 1236  BNP 9,211.9*   Basic Metabolic Panel: Recent Labs  Lab 08/17/20 1236 08/18/20 0212 08/19/20 0432 08/20/20 0259  NA 137 136 136 135  K 4.2 3.6 4.0 4.0  CL 106 105 103 105  CO2 21* 20* 21* 20*  GLUCOSE 139* 102* 107* 96  BUN 22 24* 29* 33*  CREATININE 1.10* 1.16* 1.36* 1.35*  CALCIUM 7.8* 7.5* 8.0* 7.9*   Liver Function Tests: Recent Labs  Lab 08/19/20 0432  AST 25  ALT 14  ALKPHOS 65  BILITOT 1.0  PROT 4.4*  ALBUMIN 2.1*   No results for input(s): LIPASE, AMYLASE in the last 168 hours. No results for input(s): AMMONIA in the last 168 hours. CBC: Recent Labs  Lab 08/17/20 1236 08/19/20 0432  WBC 9.5 10.1  HGB 10.0* 10.7*  HCT 32.9* 34.1*  MCV 98.2 96.6  PLT 282 340  Cardiac Enzymes: No results for input(s): CKTOTAL, CKMB, CKMBINDEX, TROPONINI in the last 168 hours. BNP: Invalid input(s): POCBNP CBG: No results for input(s): GLUCAP in the last 168 hours. D-Dimer No results for input(s): DDIMER in the last 72 hours. Hgb A1c No results for input(s): HGBA1C in the last 72 hours. Lipid Profile No results for input(s): CHOL, HDL, LDLCALC, TRIG, CHOLHDL, LDLDIRECT in the last 72 hours. Thyroid function studies No results for input(s): TSH, T4TOTAL, T3FREE, THYROIDAB in the last 72 hours.  Invalid input(s): FREET3 Anemia work up No results for input(s): VITAMINB12, FOLATE, FERRITIN, TIBC, IRON, RETICCTPCT in the last 72 hours. Urinalysis    Component Value Date/Time    COLORURINE YELLOW 08/18/2020 1600   APPEARANCEUR CLEAR 08/18/2020 1600   LABSPEC 1.009 08/18/2020 1600   PHURINE 5.0 08/18/2020 1600   GLUCOSEU NEGATIVE 08/18/2020 1600   HGBUR NEGATIVE 08/18/2020 1600   BILIRUBINUR NEGATIVE 08/18/2020 1600   KETONESUR NEGATIVE 08/18/2020 1600   PROTEINUR 30 (A) 08/18/2020 1600   NITRITE NEGATIVE 08/18/2020 1600   LEUKOCYTESUR NEGATIVE 08/18/2020 1600   Sepsis Labs Invalid input(s): PROCALCITONIN,  WBC,  LACTICIDVEN Microbiology Recent Results (from the past 240 hour(s))  Respiratory Panel by RT PCR (Flu A&B, Covid) - Nasopharyngeal Swab     Status: None   Collection Time: 08/17/20 12:37 PM   Specimen: Nasopharyngeal Swab  Result Value Ref Range Status   SARS Coronavirus 2 by RT PCR NEGATIVE NEGATIVE Final    Comment: (NOTE) SARS-CoV-2 target nucleic acids are NOT DETECTED.  The SARS-CoV-2 RNA is generally detectable in upper respiratoy specimens during the acute phase of infection. The lowest concentration of SARS-CoV-2 viral copies this assay can detect is 131 copies/mL. A negative result does not preclude SARS-Cov-2 infection and should not be used as the sole basis for treatment or other patient management decisions. A negative result may occur with  improper specimen collection/handling, submission of specimen other than nasopharyngeal swab, presence of viral mutation(s) within the areas targeted by this assay, and inadequate number of viral copies (<131 copies/mL). A negative result must be combined with clinical observations, patient history, and epidemiological information. The expected result is Negative.  Fact Sheet for Patients:  PinkCheek.be  Fact Sheet for Healthcare Providers:  GravelBags.it  This test is no t yet approved or cleared by the [place] FDA and  has been authorized for detection and/or diagnosis of SARS-CoV-2 by FDA under an Emergency Use  Authorization (EUA). This EUA will remain  in effect (meaning this test can be used) for the duration of the COVID-19 declaration under Section 564(b)(1) of the Act, 21 U.S.C. section 360bbb-3(b)(1), unless the authorization is terminated or revoked sooner.     Influenza A by PCR NEGATIVE NEGATIVE Final   Influenza B by PCR NEGATIVE NEGATIVE Final    Comment: (NOTE) The Xpert Xpress SARS-CoV-2/FLU/RSV assay is intended as an aid in  the diagnosis of influenza from Nasopharyngeal swab specimens and  should not be used as a sole basis for treatment. Nasal washings and  aspirates are unacceptable for Xpert Xpress SARS-CoV-2/FLU/RSV  testing.  Fact Sheet for Patients: PinkCheek.be  Fact Sheet for Healthcare Providers: GravelBags.it  This test is not yet approved or cleared by the [place] FDA and  has been authorized for detection and/or diagnosis of SARS-CoV-2 by  FDA under an Emergency Use Authorization (EUA). This EUA will remain  in effect (meaning this test can be used) for the duration of the  Covid-19 declaration under Section 564(b)(1)  of the Act, 21  U.S.C. section 360bbb-3(b)(1), unless the authorization is  terminated or revoked. Performed at [facility], [place] [address] [place], [place] [address]   MRSA PCR Screening     Status: Abnormal   Collection Time: 08/19/20  5:31 AM   Specimen: Nasopharyngeal  Result Value Ref Range Status   MRSA by PCR POSITIVE (A) NEGATIVE Final    Comment:        The GeneXpert MRSA Assay (FDA approved for NASAL specimens only), is one component of a comprehensive MRSA colonization surveillance program. It is not intended to diagnose MRSA infection nor to guide or monitor treatment for MRSA infections. RESULT CALLED TO, READ BACK BY AND VERIFIED WITH: Tenny Craw RN 10:00 08/19/20 ([email or web address]) Performed at [facility], [place] [address] , [place] [address]       Time coordinating discharge: Over 30 minutes  SIGNED:   Charlynne Cousins, MD  [facility] 08/21/2020, 8:20 AM Pager   If 7PM-7AM, please contact night-coverage [email or web address] Password [email or web address]

## 2020-08-21 NOTE — Progress Notes (Signed)
°   08/21/20 1700  Clinical Encounter Type  Visited With Patient and family together  Visit Type Initial  Referral From Nurse  Consult/Referral To Chaplain  Spiritual Encounters  Spiritual Needs Emotional;Grief support  The chaplain sat with the patient and her family. The patient will be transferred to hospice. The family needed social and emotional support.

## 2020-08-21 NOTE — Progress Notes (Addendum)
UPDATE: [facility] available at this time. Family [facility], referral made with [facility], they can take pt on 10/23. MD and ALF made aware.  CSW called (2:14 pm) and made referral for [facility], was told that the intake nurse would call back shortly. CSW reached back out to check on the status of referral at 3:35 pm and had to leave a vm for Cassandra and faxed information over for referral. CSW updated the family and advised we are waiting on the intake nurse to call. CSW suggested family reach out to Hospice to see about availability, daughter Cecille Rubin called, was told that intake nurse was on the other line and would call back shortly. CSW will continue to follow.

## 2020-08-21 NOTE — Care Management Important Message (Signed)
Important Message  Patient Details  Name: Tracy Valdez MRN: [id number] Date of Birth: Apr 28, 1934   Medicare Important Message Given:  Yes     Shelda Altes 08/21/2020, 8:45 AM

## 2020-08-21 NOTE — Progress Notes (Signed)
Nutrition Brief Note  Chart reviewed. Pt now transitioning to comfort care.  No further nutrition interventions warranted at this time.  Please re-consult as needed.   Axiel Fjeld W, RD, LDN, CDCES Registered Dietitian II Certified Diabetes Care and Education Specialist Please refer to AMION for RD and/or RD on-call/weekend/after hours pager  

## 2020-08-22 DIAGNOSIS — I10 Essential (primary) hypertension: Secondary | ICD-10-CM | POA: Diagnosis not present

## 2020-08-22 DIAGNOSIS — I5031 Acute diastolic (congestive) heart failure: Secondary | ICD-10-CM | POA: Diagnosis not present

## 2020-08-22 DIAGNOSIS — J9601 Acute respiratory failure with hypoxia: Secondary | ICD-10-CM | POA: Diagnosis not present

## 2020-08-22 DIAGNOSIS — F0390 Unspecified dementia without behavioral disturbance: Secondary | ICD-10-CM | POA: Diagnosis not present

## 2020-08-22 LAB — GLUCOSE, CAPILLARY: Glucose-Capillary: 69 mg/dL — ABNORMAL LOW (ref 70–99)

## 2020-08-22 NOTE — Progress Notes (Signed)
Report given to chris at hospice

## 2020-08-22 NOTE — Consult Note (Signed)
Patient referred for inpatient services at [facility].  Patient accepted for transfer today between 3-4pm.  RN to call Gerald Stabs, RN with report on patient at [phone].  Thank you for the referral.

## 2020-08-22 NOTE — TOC Transition Note (Signed)
Transition of Care [facility]) - CM/SW Discharge Note   Patient Details  Name: Tracy Valdez MRN: [id number] Date of Birth: 08/31/1934  Transition of Care [facility]) CM/SW Contact:  Bary Castilla, LCSW Phone Number: [phone] 08/22/2020, 11:18 AM   Clinical Narrative:     Patient will DC to:?[facility] date:?08/22/20 Family notified:Lori? Transport OF:VWAQ   Per MD patient ready for DC to [facility]. RN, patient, patient's family, and facility notified of DC. Discharge Summary sent to facility. RN given number for report [phone]. DC packet on chart. Ambulance transport requested for patient.   CSW signing off.   [place], [place] [phone]   Final next level of care: [place] Barriers to Discharge: Barriers Resolved   Patient Goals and CMS Choice        Discharge Placement              Patient chooses bed at: Other - please specify in the comment section below: [facility]) Patient to be transferred to facility by: [place] Name of family member notified: Cecille Rubin Patient and family notified of of transfer: 08/22/20  Discharge Plan and Services                                     Social Determinants of Health (SDOH) Interventions     Readmission Risk Interventions No flowsheet data found.

## 2020-08-22 NOTE — Progress Notes (Signed)
[facility] PROGRESS NOTE    Progress Note  Tracy Valdez  OHY:[id number] DOB: 08-05-1934 DOA: 08/17/2020 PCP: Curly Rim, MD     Brief Narrative:   Tracy Valdez is an 86 y.o. female past medical history of essential hypertension, DVT no longer on anticoagulation, chronic diastolic heart failure, chronic kidney disease, advanced vascular dementia, history of right-sided stroke with left residual weakness, with ambulatory dysfunction who resides at a skilled nursing facility presents with shortness of breath and lower extremity edema found to be in acute decompensated heart failure.  Assessment/Plan:   Acute hypoxic respiratory failure secondarily to acute decompensated diastolic heart failure: He was started on IV Lasix, she had gained at least 15 pounds of fluid compared to recent evaluation. She has lower extremity edema, but I cannot appreciate any JVD. Palliative care was consulted, who met with family and decided to move towards comfort care she is awaiting placement.  Severe aortic regurgitation: All of her medications have been DC'd except medication for comfort like morphine and Ativan. She is not a candidate for surgical intervention due to her cognitive decline and functional limitations.  Essential hypertension: Stop all of her antihypertensive medication.  Sinus bradycardia: Appears to be nocturnal TSH within normal.  Second-degree AV block Mobitz type I With her current PACs, but no A. Fib.  Chronic kidney disease stage II: Creatinine appears to be at baseline.  History of DVT: She received an abbreviated course of Xarelto for DVT.  Anemia of chronic disease: Noted, currently stable.  End-of-life: Palliative care has been consulted, family is undecided whether to move towards comfort care versus palliative care.  Unstagable left hip pressure ulcer present on admission: RN Pressure Injury Documentation: Pressure Injury 08/18/20 Hip Left  Unstageable - Full thickness tissue loss in which the base of the injury is covered by slough (yellow, tan, gray, green or brown) and/or eschar (tan, brown or black) in the wound bed. (Active)  08/18/20   Location: Hip  Location Orientation: Left  Staging: Unstageable - Full thickness tissue loss in which the base of the injury is covered by slough (yellow, tan, gray, green or brown) and/or eschar (tan, brown or black) in the wound bed.  Wound Description (Comments):   Present on Admission: Yes    Estimated body mass index is 21.74 kg/m as calculated from the following:   Height as of 03/21/20: _0  (1.549 m).   Weight as of this encounter: 52.2 kg.   DVT prophylaxis: Xarelto Family Communication:none Status is: Inpatient  Remains inpatient appropriate because:Hemodynamically unstable   Dispo: The patient is from: SNF              Anticipated d/c is to: SNF              Anticipated d/c date is: 1 days              Patient currently residential hospice facility today 08/22/2020        Code Status:     Code Status Orders  (From admission, onward)           Start     Ordered   08/17/20 1433  Full code  Continuous        08/17/20 1435           Code Status History     This patient has a current code status but no historical code status.   Advance Care Planning Activity  IV Access:    Peripheral IV   Procedures and diagnostic studies:   No results found.   Medical Consultants:    None.  Anti-Infectives:   None  Subjective:    Tracy Valdez comfortably in bed.  Objective:    Vitals:   08/20/20 2052 08/21/20 1135 08/21/20 2051 08/21/20 2053  BP: (!) 181/48 (!) 156/78 (!) 191/51 (!) 180/40  Pulse: 70 73 76 71  Resp: _0 Temp: 97.9 F (36.6 C) 98.2 F (36.8 C) 98.2 F (36.8 C)   TempSrc: Oral Oral    SpO2: 93% 90% 99%   Weight:       SpO2: 99 % O2 Flow Rate (L/min): 2 L/min   Intake/Output Summary (Last 24  hours) at 08/22/2020 0917 Last data filed at 08/22/2020 0500 Gross per 24 hour  Intake 10 ml  Output 0 ml  Net 10 ml   Filed Weights   08/19/20 0402 08/20/20 0443  Weight: 54.2 kg 52.2 kg    Exam: General exam: In no acute distress. Respiratory system: Good air movement with crackles at bases Cardiovascular system: S1 & S2 heard, RRR. No JVD. Gastrointestinal system: Abdomen is nondistended, soft and nontender.  Extremities: No pedal edema. Skin: No rashes, lesions or ulcers Psychiatry: No judgment or insight of medical condition  Data Reviewed:    Labs: Basic Metabolic Panel: Recent Labs  Lab 08/17/20 1236 08/17/20 1236 08/18/20 0212 08/18/20 0212 08/19/20 0432 08/20/20 0259  NA 137  --  136  --  136 135  K 4.2   < > 3.6   < > 4.0 4.0  CL 106  --  105  --  103 105  CO2 21*  --  20*  --  21* 20*  GLUCOSE 139*  --  102*  --  107* 96  BUN 22  --  24*  --  29* 33*  CREATININE 1.10*  --  1.16*  --  1.36* 1.35*  CALCIUM 7.8*  --  7.5*  --  8.0* 7.9*   < > = values in this interval not displayed.   GFR Estimated Creatinine Clearance: 23.4 mL/min (A) (by C-G formula based on SCr of 1.35 mg/dL (H)). Liver Function Tests: Recent Labs  Lab 08/19/20 0432  AST 25  ALT 14  ALKPHOS 65  BILITOT 1.0  PROT 4.4*  ALBUMIN 2.1*   No results for input(s): LIPASE, AMYLASE in the last 168 hours. No results for input(s): AMMONIA in the last 168 hours. Coagulation profile No results for input(s): INR, PROTIME in the last 168 hours. COVID-19 Labs  No results for input(s): DDIMER, FERRITIN, LDH, CRP in the last 72 hours.  Lab Results  Component Value Date   [place] NEGATIVE 08/17/2020    CBC: Recent Labs  Lab 08/17/20 1236 08/19/20 0432  WBC 9.5 10.1  HGB 10.0* 10.7*  HCT 32.9* 34.1*  MCV 98.2 96.6  PLT 282 340   Cardiac Enzymes: No results for input(s): CKTOTAL, CKMB, CKMBINDEX, TROPONINI in the last 168 hours. BNP (last 3 results) No results for  input(s): PROBNP in the last 8760 hours. CBG: Recent Labs  Lab 08/22/20 0546  GLUCAP 69*   D-Dimer: No results for input(s): DDIMER in the last 72 hours. Hgb A1c: No results for input(s): HGBA1C in the last 72 hours. Lipid Profile: No results for input(s): CHOL, HDL, LDLCALC, TRIG, CHOLHDL, LDLDIRECT in the last 72 hours. Thyroid function studies: No results for input(s): TSH, T4TOTAL, T3FREE, THYROIDAB  in the last 72 hours.  Invalid input(s): FREET3 Anemia work up: No results for input(s): VITAMINB12, FOLATE, FERRITIN, TIBC, IRON, RETICCTPCT in the last 72 hours. Sepsis Labs: Recent Labs  Lab 08/17/20 1236 08/19/20 0432  WBC 9.5 10.1   Microbiology Recent Results (from the past 240 hour(s))  Respiratory Panel by RT PCR (Flu A&B, Covid) - Nasopharyngeal Swab     Status: None   Collection Time: 08/17/20 12:37 PM   Specimen: Nasopharyngeal Swab  Result Value Ref Range Status   SARS Coronavirus 2 by RT PCR NEGATIVE NEGATIVE Final    Comment: (NOTE) SARS-CoV-2 target nucleic acids are NOT DETECTED.  The SARS-CoV-2 RNA is generally detectable in upper respiratoy specimens during the acute phase of infection. The lowest concentration of SARS-CoV-2 viral copies this assay can detect is 131 copies/mL. A negative result does not preclude SARS-Cov-2 infection and should not be used as the sole basis for treatment or other patient management decisions. A negative result may occur with  improper specimen collection/handling, submission of specimen other than nasopharyngeal swab, presence of viral mutation(s) within the areas targeted by this assay, and inadequate number of viral copies (<131 copies/mL). A negative result must be combined with clinical observations, patient history, and epidemiological information. The expected result is Negative.  Fact Sheet for Patients:  PinkCheek.be  Fact Sheet for Healthcare Providers:    GravelBags.it  This test is no t yet approved or cleared by the [place] FDA and  has been authorized for detection and/or diagnosis of SARS-CoV-2 by FDA under an Emergency Use Authorization (EUA). This EUA will remain  in effect (meaning this test can be used) for the duration of the COVID-19 declaration under Section 564(b)(1) of the Act, 21 U.S.C. section 360bbb-3(b)(1), unless the authorization is terminated or revoked sooner.     Influenza A by PCR NEGATIVE NEGATIVE Final   Influenza B by PCR NEGATIVE NEGATIVE Final    Comment: (NOTE) The Xpert Xpress SARS-CoV-2/FLU/RSV assay is intended as an aid in  the diagnosis of influenza from Nasopharyngeal swab specimens and  should not be used as a sole basis for treatment. Nasal washings and  aspirates are unacceptable for Xpert Xpress SARS-CoV-2/FLU/RSV  testing.  Fact Sheet for Patients: PinkCheek.be  Fact Sheet for Healthcare Providers: GravelBags.it  This test is not yet approved or cleared by the [place] FDA and  has been authorized for detection and/or diagnosis of SARS-CoV-2 by  FDA under an Emergency Use Authorization (EUA). This EUA will remain  in effect (meaning this test can be used) for the duration of the  Covid-19 declaration under Section 564(b)(1) of the Act, 21  U.S.C. section 360bbb-3(b)(1), unless the authorization is  terminated or revoked. Performed at [facility], [place] [address] [place], [place] [address]   MRSA PCR Screening     Status: Abnormal   Collection Time: 08/19/20  5:31 AM   Specimen: Nasopharyngeal  Result Value Ref Range Status   MRSA by PCR POSITIVE (A) NEGATIVE Final    Comment:        The GeneXpert MRSA Assay (FDA approved for NASAL specimens only), is one component of a comprehensive MRSA colonization surveillance program. It is not intended to diagnose MRSA infection nor to  guide or monitor treatment for MRSA infections. RESULT CALLED TO, READ BACK BY AND VERIFIED WITH: Tenny Craw RN 10:00 08/19/20 ([email or web address]) Performed at [facility], [place] [address] [place], [place] [address]      Medications:   .  guaiFENesin-dextromethorphan  10 mL Oral Once  . sodium chloride flush  3 mL Intravenous Q12H   Continuous Infusions: . sodium chloride        LOS: 5 days   Charlynne Cousins  [facility]  08/22/2020, 9:17 AM

## 2020-08-31 DEATH — deceased

## 2020-09-30 DEATH — deceased

## 2021-02-28 IMAGING — US US EXTREM LOW VENOUS*L*
1 series · 13 of 24 positions shown · non-contrast
Comparison: None.

CLINICAL DATA: Left ankle pain and swelling for 3 days



[Series 1: us extrem low venous*left* · 13 of 39 slices shown]
[im 1/39]
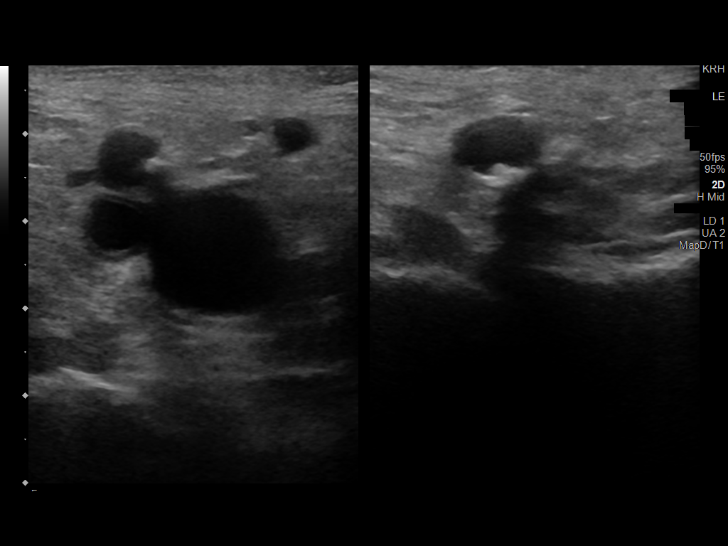
[im 4/39]
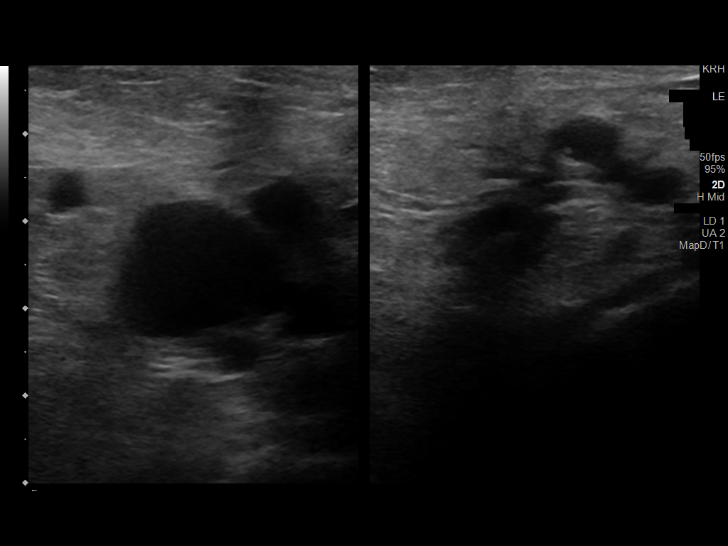
[im 7/39]
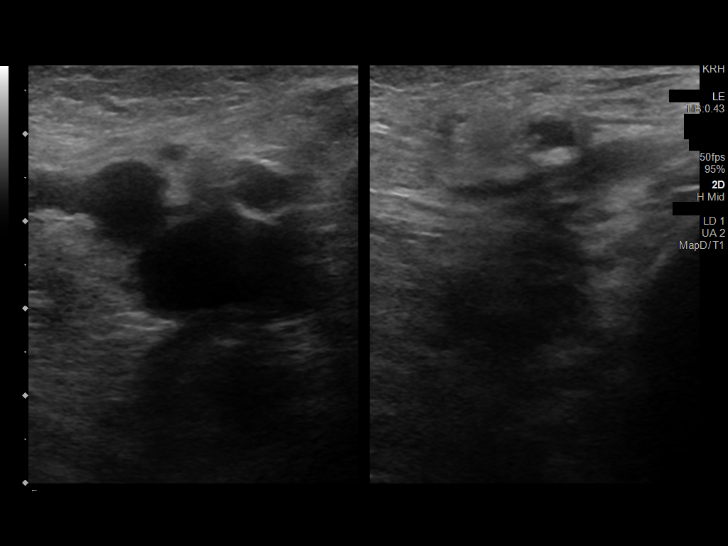
[im 10/39]
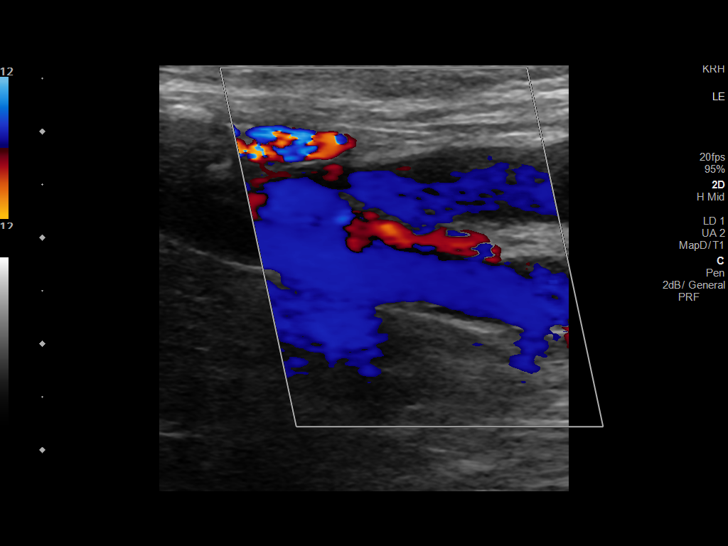
[im 14/39]
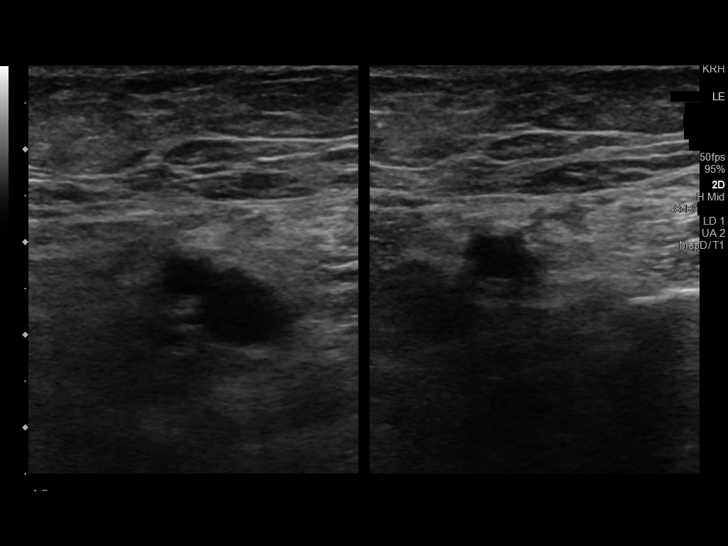
[im 17/39]
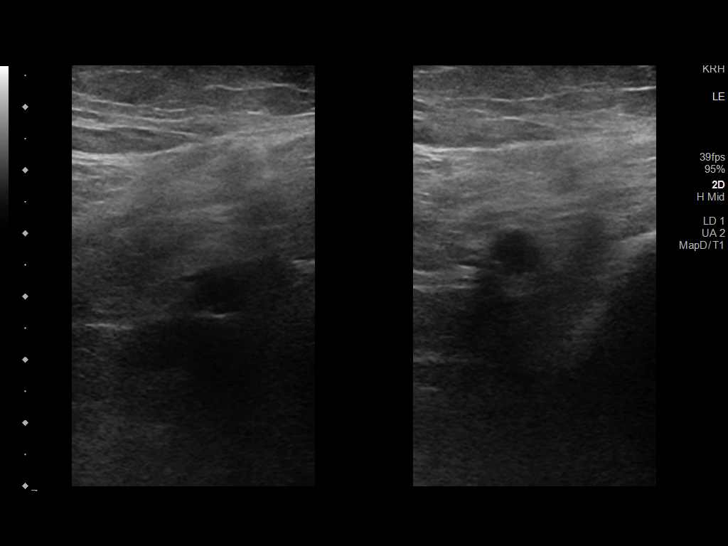
[im 20/39]
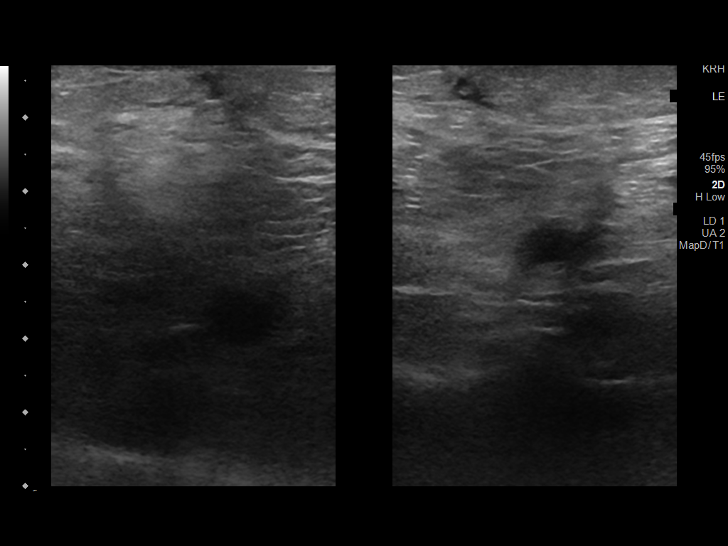
[im 22/39]
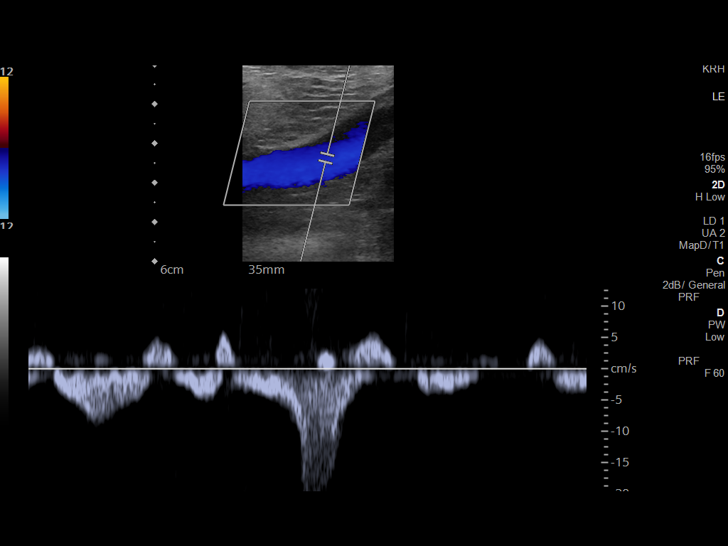
[im 25/39]
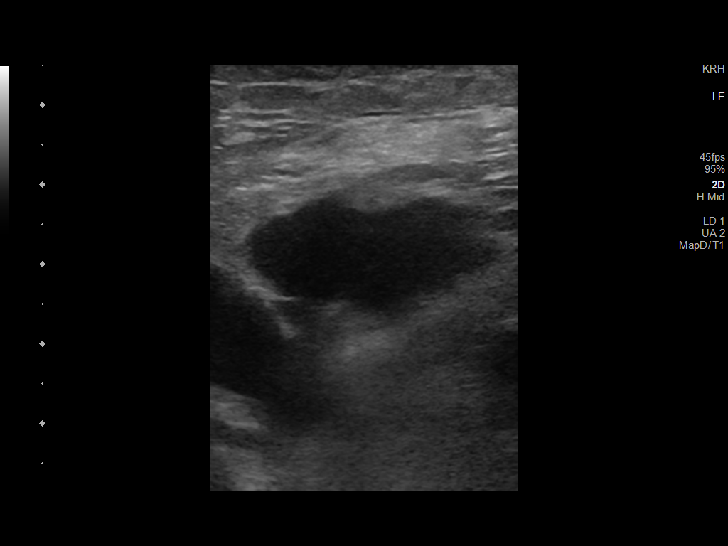
[im 29/39]
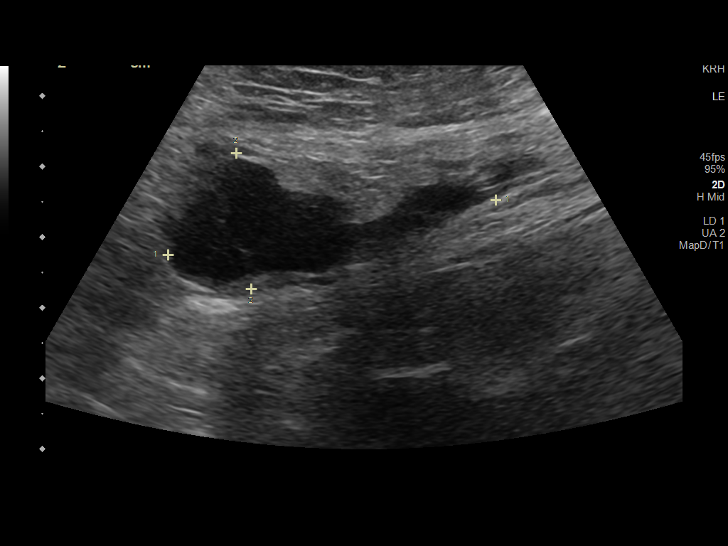
[im 32/39]
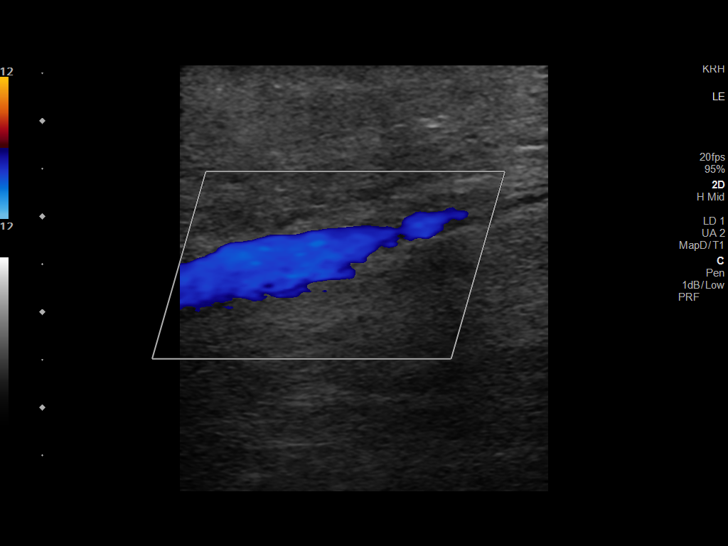
[im 35/39]
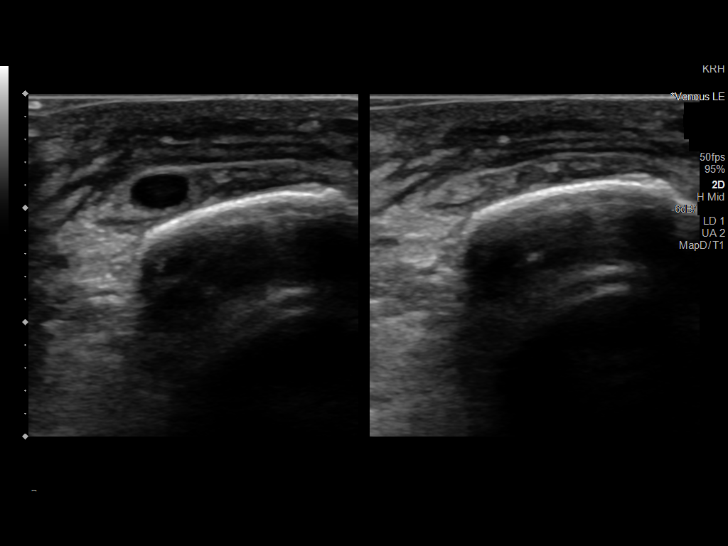
[im 39/39]
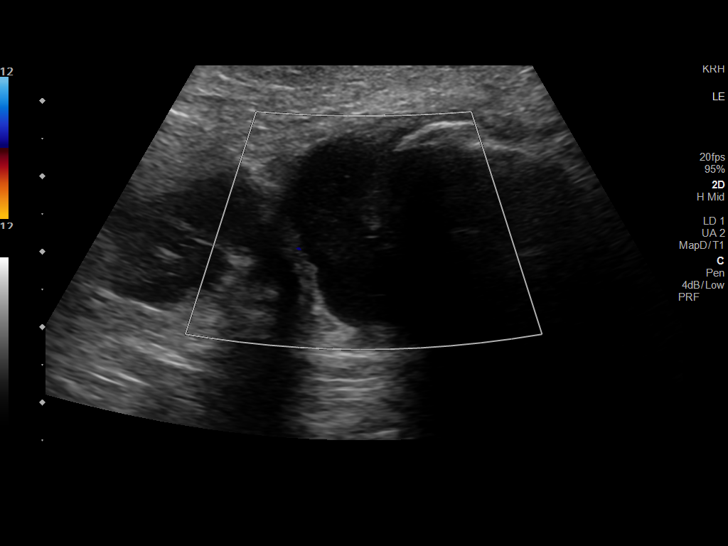

[13 of 24 positions shown; findings below may reference images not displayed]

FINDINGS: Contralateral Common Femoral Vein: Respiratory phasicity is normal
and symmetric with the symptomatic side. No evidence of thrombus.
Normal compressibility.

Common Femoral Vein: No evidence of thrombus. Normal
compressibility, respiratory phasicity and response to augmentation.

Saphenofemoral Junction: No evidence of thrombus. Normal
compressibility and flow on color Doppler imaging.

Profunda Femoral Vein: No evidence of thrombus. Normal
compressibility and flow on color Doppler imaging.

Femoral Vein: No evidence of thrombus. Normal compressibility,
respiratory phasicity and response to augmentation.

Popliteal Vein: No evidence of thrombus. Normal compressibility,
respiratory phasicity and response to augmentation.

Calf Veins: No evidence of thrombus. Normal compressibility and flow
on color Doppler imaging.

Superficial Great Saphenous Vein: No evidence of thrombus. Normal
compressibility.

Venous Reflux:  None.

Other Findings: Complex fluid collection in the medial popliteal
fossa is noted measuring 4.7 x 1.9 x 3.1 cm consistent with complex
popliteal cyst.
IMPRESSION: No evidence of deep venous thrombosis.

Complex popliteal cyst.

## 2021-07-27 IMAGING — CR DG FEMUR 2+V*R*
4 series · 4 of 4 positions shown · non-contrast
Comparison: None.

CLINICAL DATA: Fall.

EXAM:
RIGHT FEMUR 2 VIEWS

[femur lat (1 of 2)]
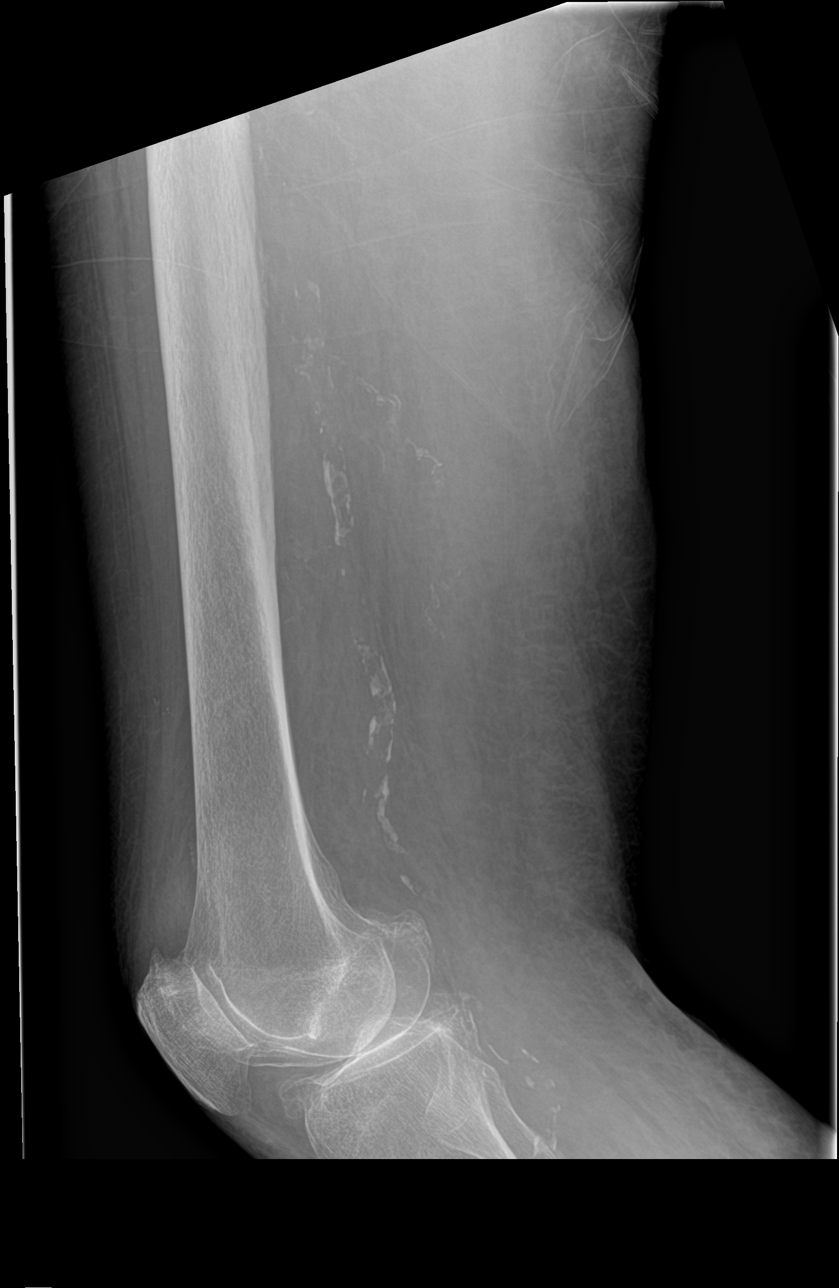

[femur lat (2 of 2)]
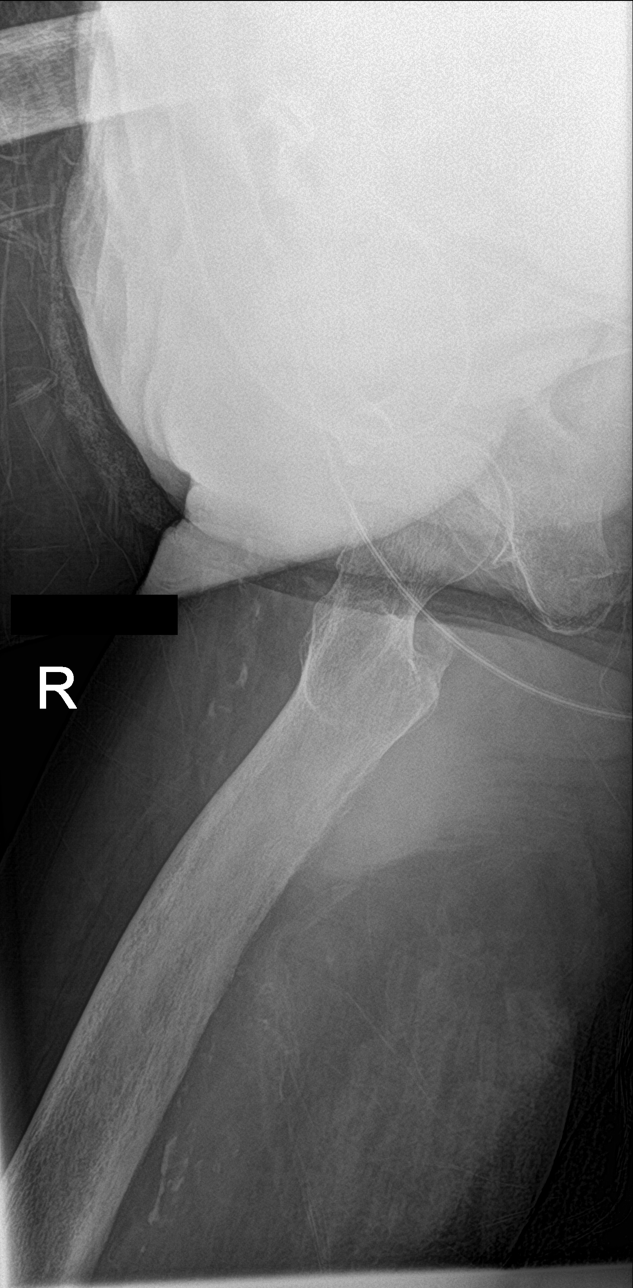

[femur ap (1 of 2)]
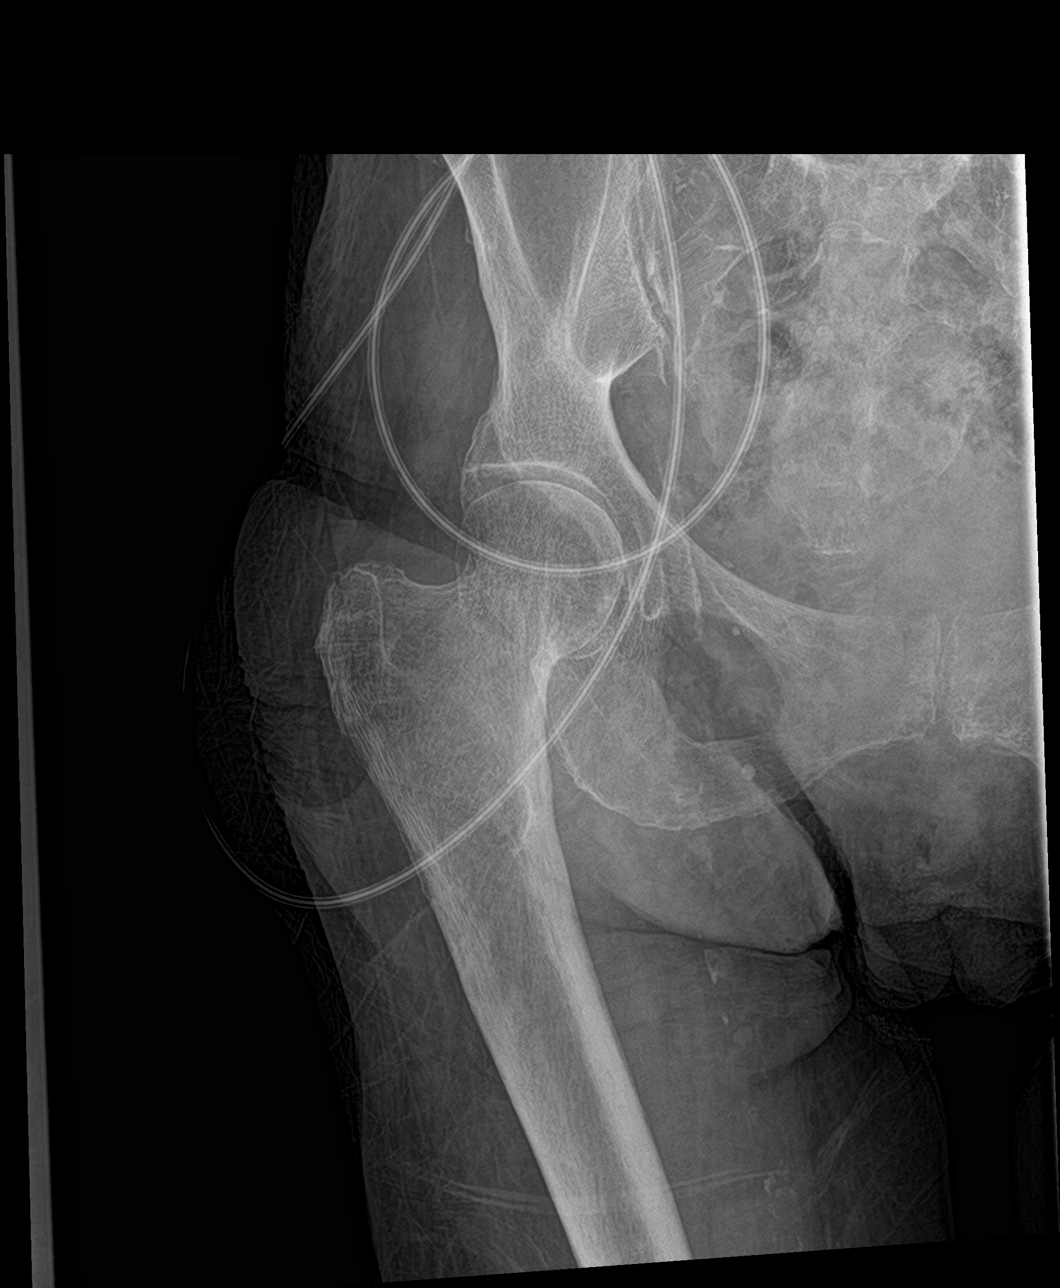

[femur ap (2 of 2)]
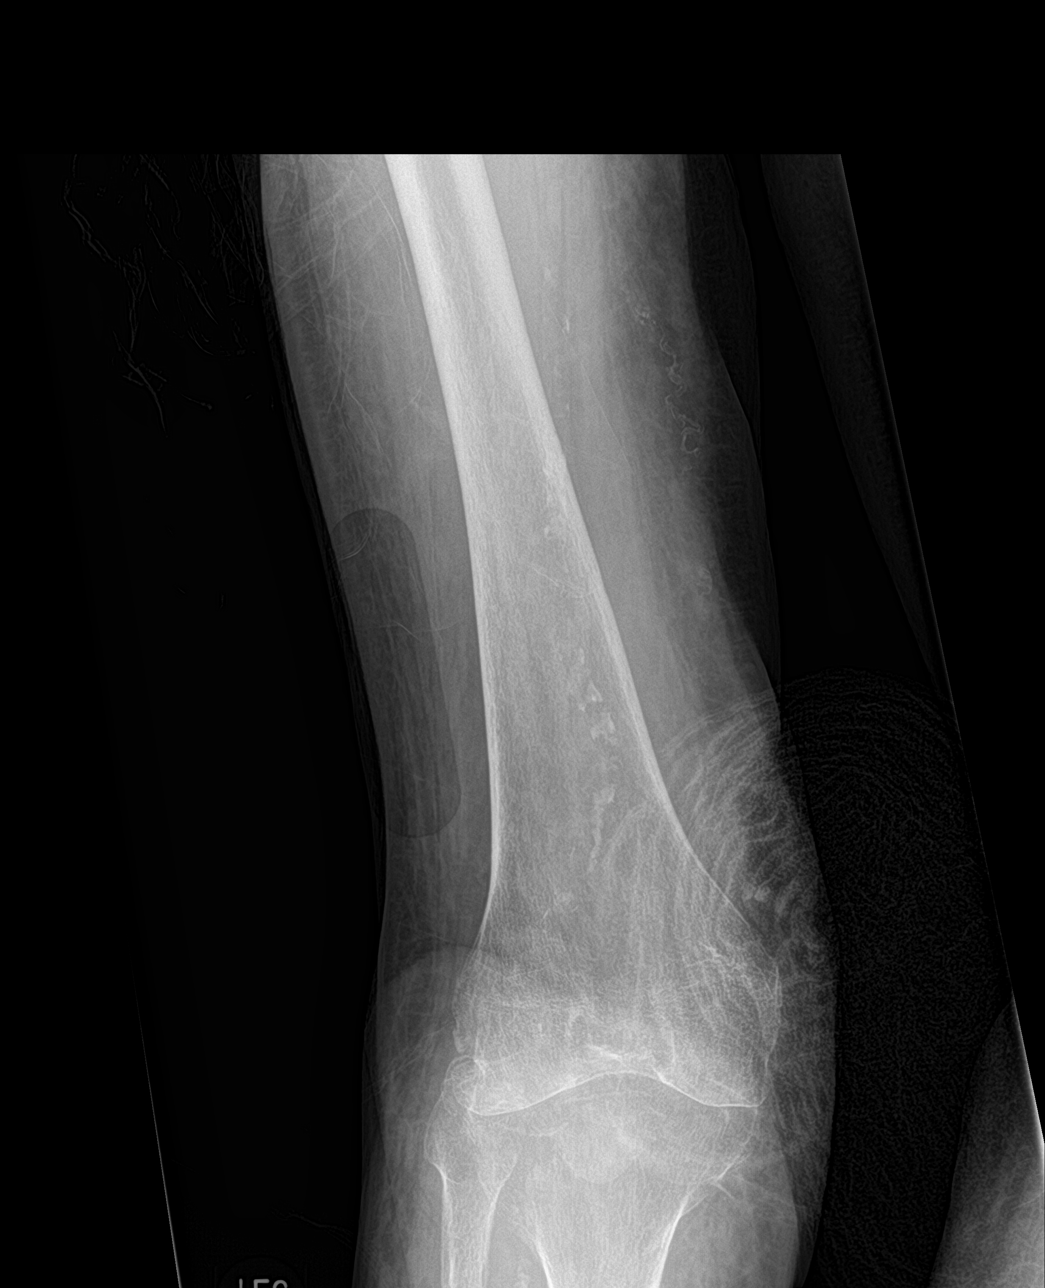

[4 of 4 positions shown; findings below may reference images not displayed]

FINDINGS: There is no evidence of fracture or other focal bone lesions. Soft
tissues are unremarkable.
IMPRESSION: Negative.

## 2021-07-28 IMAGING — DX DG CHEST 1V PORT
1 series · 1 of 1 positions shown · non-contrast
Comparison: 08/17/2020

CLINICAL DATA: Congestive failure

EXAM:
PORTABLE CHEST 1 VIEW

[chest ap]
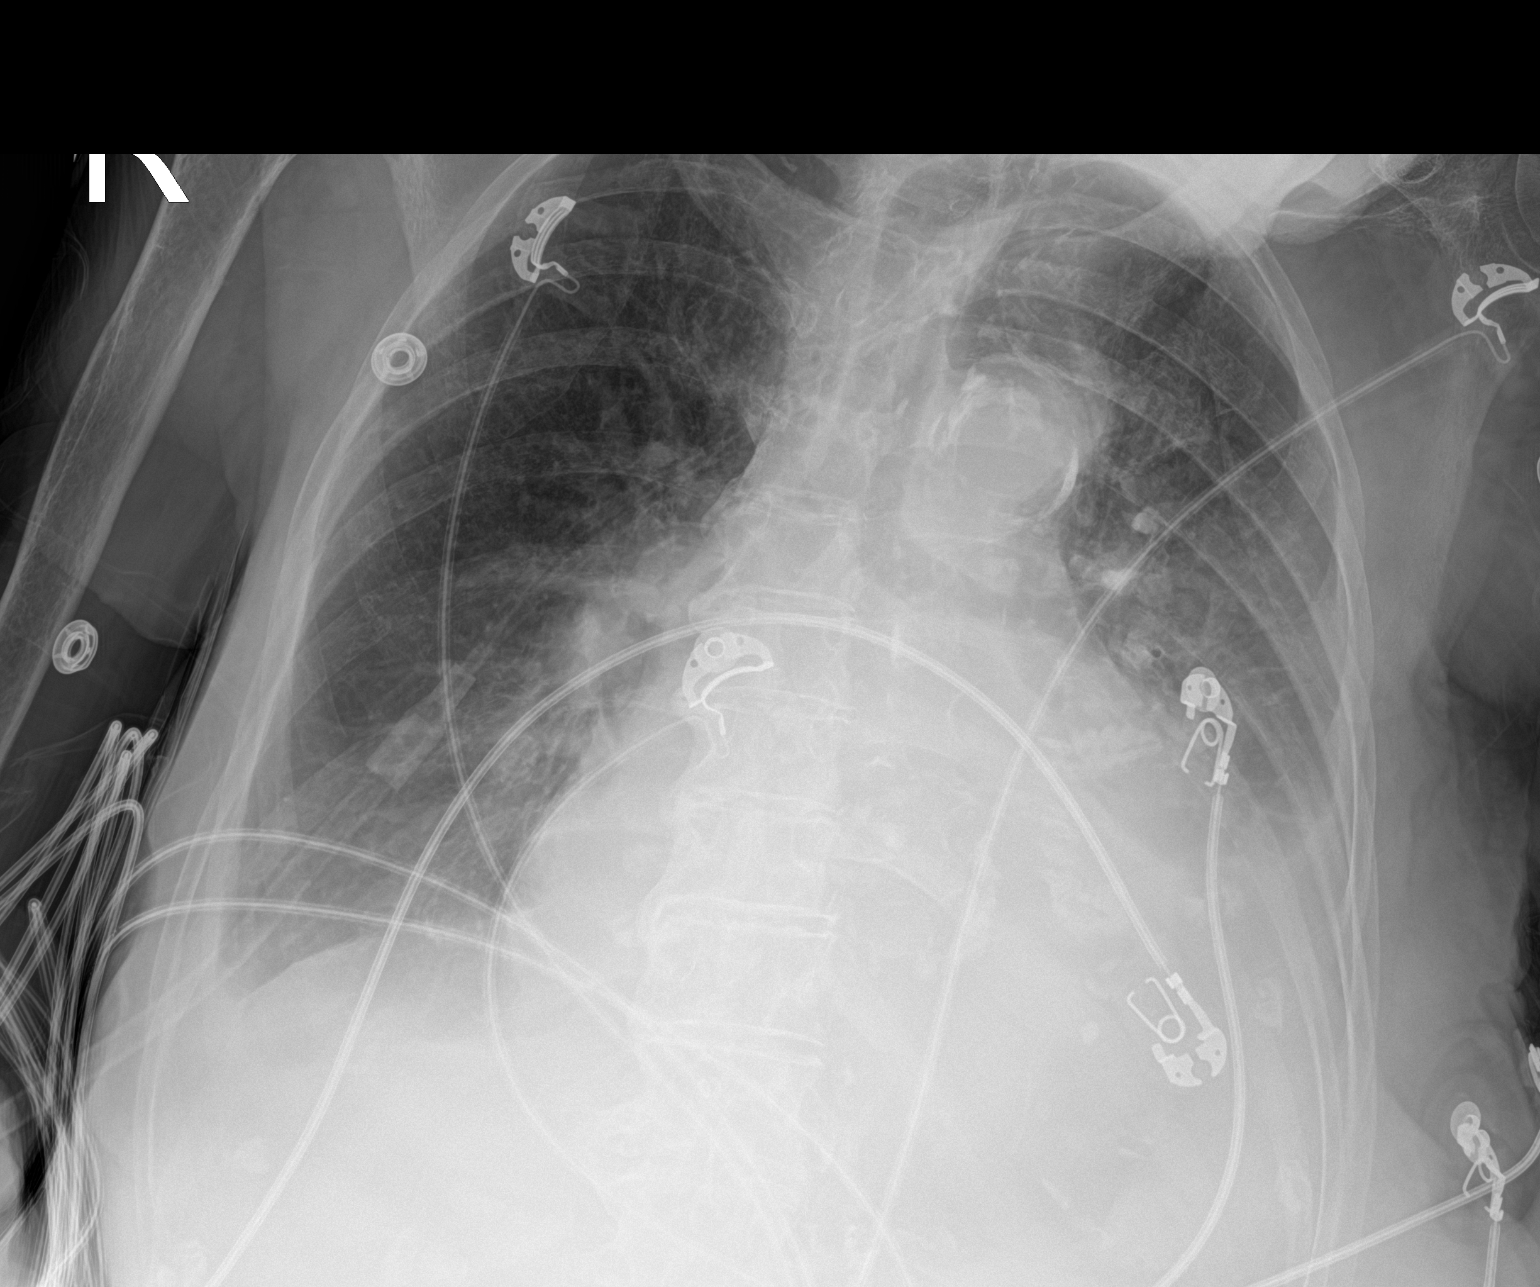

[1 of 1 positions shown; findings below may reference images not displayed]

FINDINGS: Cardiac shadow is enlarged in size. Aortic calcifications are noted.
Increasing vascular congestion is noted with mild interstitial
edema. Bilateral pleural effusions are noted left greater than
right. No bony abnormality is seen.
IMPRESSION: CHF with bilateral effusions left greater than right. The overall
appearance is stable from the prior exam.
